# Patient Record
Sex: Male | Born: 1965 | Race: White | Hispanic: No | Marital: Single | Smoking: Never smoker
Health system: Southern US, Community
[De-identification: ages and names within clinical notes are randomized; demographics above are authoritative.]

## PROBLEM LIST (undated history)

## (undated) DIAGNOSIS — K5792 Diverticulitis of intestine, part unspecified, without perforation or abscess without bleeding: Secondary | ICD-10-CM

## (undated) DIAGNOSIS — E669 Obesity, unspecified: Secondary | ICD-10-CM

## (undated) DIAGNOSIS — E785 Hyperlipidemia, unspecified: Secondary | ICD-10-CM

## (undated) HISTORY — DX: Obesity, unspecified: E66.9

## (undated) HISTORY — PX: HERNIA REPAIR: SHX51

---

## 2001-01-30 ENCOUNTER — Ambulatory Visit (HOSPITAL_BASED_OUTPATIENT_CLINIC_OR_DEPARTMENT_OTHER): Admission: RE | Admit: 2001-01-30 | Discharge: 2001-01-30 | Payer: Self-pay | Admitting: General Surgery

## 2009-10-13 ENCOUNTER — Encounter: Admission: RE | Admit: 2009-10-13 | Discharge: 2009-10-13 | Payer: Self-pay | Admitting: Family Medicine

## 2010-02-04 ENCOUNTER — Encounter: Admission: RE | Admit: 2010-02-04 | Discharge: 2010-02-04 | Payer: Self-pay | Admitting: Orthopedic Surgery

## 2010-11-01 ENCOUNTER — Encounter: Payer: Self-pay | Admitting: Orthopedic Surgery

## 2011-02-26 NOTE — Op Note (Signed)
[facility]  Patient:    Matthew Mack, Matthew Mack                         MRN: [id number] Proc. Date: 01/30/01 Adm. Date:  [id number] Attending:  Glenna Fellows Tappan                           Operative Report  PREOPERATIVE DIAGNOSIS:  Left inguinal hernia.  POSTOPERATIVE DIAGNOSIS:  Left inguinal hernia.  PROCEDURE:  Laparoscopic repair of left inguinal hernia.  SURGEON:  Lorne Skeens. Hoxworth, M.D.  ANESTHESIA:  General.  BRIEF HISTORY:  Mr. Mauger is a 45 year old white male who recently presented with the onset of pain and a bulge in his left groin, and exam confirms a moderate-size reducible left inguinal hernia.  No hernia was palpable on the right.  Options for repair, including open and laparoscopic repair, were discussed, and we have elected to proceed with laparoscopic left inguinal hernia repair.  The nature of the procedure, its indications and risks of bleeding, infection, and recurrence, were discussed and understood preoperatively.  He is now brought to the operating room for this procedure.  DESCRIPTION OF PROCEDURE:  Patient brought to the operating room and placed in the supine position on the operating table, and general laryngeal mask anesthesia was induced.  Foley catheter was placed.  The abdomen was sterilely prepped and draped.  Local anesthesia was used to infiltrate the trocar sites prior to the incisions.  A 1 cm incision was made in the umbilicus and dissection carried down to the anterior fascia.  This was incised transversely for 1 cm and the medial edge of the left rectus muscle identified and the preperitoneal space entered below this.  The balloon dissector was placed into this space and the tip passed carefully to the pubic symphysis.  It was then inflated with good bilateral deployment of the balloon, and it was left in place for three minutes.  The balloon was then removed and the structural balloon placed and  pneumoperitoneum established.  There was very good dissection of the preperitoneal space.  The right side was not explored.  On the left, the pubic symphysis was identified and Coopers ligament cleared down to the iliac vessels, which were carefully identified and protected. Laterally there had been good dissection of the peritoneum off the anterior abdominal wall out to the anterior superior iliac spine.  The peritoneum was further dissected posteriorly, working medially.  The epigastric vessels were identified and protected along the anterior abdominal wall.  The cord structures were identified.  There was no direct defect medial to the epigastric vessels.  There was, however, a good-sized indirect sac associated with the cord structures.  This peritoneal sac was carefully dissected away from cord structures and completely dissected out of the internal ring and then dissected well posteriorly off the cord structures.  The cord structure was skeletonized and encircled, and no further sac was associated with this. There was also a moderate-sized cord lipoma that was dissected out of the internal ring and dissected posteriorly.  Following thorough dissection, a piece of Prolene mesh was trimmed 4 x 6 inches, slightly tapering laterally, and was placed into the preperitoneal space and unfurled.  It was initially tacked to the pubic symphysis and then aligned and tacked along the Coopers ligament, down to the iliac vessels.  The mesh was then tacked along the midline, working superiorly.  The anterior superior corner was then tacked well out toward the anterior superior iliac spine, and the superior edge of the mesh was tacked to the anterior abdominal wall, carefully avoiding the epigastric vessels and being able to easily feel each tack through the anterior abdominal wall.  All tacks were placed being able to feel them through the anterior abdominal wall to avoid nerve injury.  This provided  very good coverage of the direct and indirect spaces.  Following this, the dissected indirect sac was placed deep to the mesh and held to the mesh with one tack.  Trocars were removed under direct vision, all CO2 evacuated.  The fascial defect in the midline was closed with a figure-of-eight suture of 0 Vicryl.  Skin incisions were closed with interrupted subcuticular 4-0 Monocryl and Steri-Strips.  Sponge and needle counts correct.  Dry sterile dressings were applied and the patient taken to the recovery room in good condition. DD:  01/30/01 TD:  01/31/01 Job: [id number]

## 2017-12-30 DIAGNOSIS — R7301 Impaired fasting glucose: Secondary | ICD-10-CM | POA: Diagnosis not present

## 2017-12-30 DIAGNOSIS — E669 Obesity, unspecified: Secondary | ICD-10-CM | POA: Diagnosis not present

## 2017-12-30 DIAGNOSIS — R030 Elevated blood-pressure reading, without diagnosis of hypertension: Secondary | ICD-10-CM | POA: Diagnosis not present

## 2017-12-30 DIAGNOSIS — R5383 Other fatigue: Secondary | ICD-10-CM | POA: Diagnosis not present

## 2018-04-07 DIAGNOSIS — Z23 Encounter for immunization: Secondary | ICD-10-CM | POA: Diagnosis not present

## 2018-04-07 DIAGNOSIS — Z0000 Encounter for general adult medical examination without abnormal findings: Secondary | ICD-10-CM | POA: Diagnosis not present

## 2018-04-07 DIAGNOSIS — Z125 Encounter for screening for malignant neoplasm of prostate: Secondary | ICD-10-CM | POA: Diagnosis not present

## 2018-04-07 DIAGNOSIS — Z13220 Encounter for screening for lipoid disorders: Secondary | ICD-10-CM | POA: Diagnosis not present

## 2018-08-18 DIAGNOSIS — Z23 Encounter for immunization: Secondary | ICD-10-CM | POA: Diagnosis not present

## 2019-04-10 DIAGNOSIS — Z0000 Encounter for general adult medical examination without abnormal findings: Secondary | ICD-10-CM | POA: Diagnosis not present

## 2019-06-22 ENCOUNTER — Other Ambulatory Visit: Payer: Self-pay

## 2019-06-22 ENCOUNTER — Ambulatory Visit
Admission: EM | Admit: 2019-06-22 | Discharge: 2019-06-22 | Disposition: A | Discharge status: Home / Self Care | Payer: BC Managed Care – PPO | Attending: Emergency Medicine | Admitting: Emergency Medicine

## 2019-06-22 ENCOUNTER — Encounter: Payer: Self-pay | Admitting: Emergency Medicine

## 2019-06-22 DIAGNOSIS — K5732 Diverticulitis of large intestine without perforation or abscess without bleeding: Secondary | ICD-10-CM | POA: Diagnosis not present

## 2019-06-22 HISTORY — DX: Diverticulitis of intestine, part unspecified, without perforation or abscess without bleeding: K57.92

## 2019-06-22 MED ORDER — CIPROFLOXACIN HCL 500 MG PO TABS: 500.0000 mg | ORAL_TABLET | Freq: Two times a day (BID) | ORAL | 0 refills | Status: DC

## 2019-06-22 MED ORDER — METRONIDAZOLE 500 MG PO TABS: 500.0000 mg | ORAL_TABLET | Freq: Three times a day (TID) | ORAL | 0 refills | Status: DC

## 2019-06-22 NOTE — ED Provider Notes (Signed)
[name]    CSN: [id number] Arrival date & time: 06/22/19  1824      History   Chief Complaint Chief Complaint  Patient presents with  . Abdominal Pain    HPI Matthew Mack is a 53 y.o. male history of diverticulitis presenting for diffuse lower abdominal pain for last 3 days.  States pain was worse last night, feeling slightly improved today.  States is been years since his last flare which resolved with oral antibiotics.  Endorsing nausea, diarrhea, decreased appetite.  Denies blood, mucus, fever, decreased flatus.  Patient is status post hernia repair that was done laparoscopically with mesh placement greater than 5 years ago without postoperative complication.  Patient denies coagulopathy, blood thinner/anticoagulant use.  Past Medical History:  Diagnosis Date  . Diverticulitis     There are no active problems to display for this patient.   Past Surgical History:  Procedure Laterality Date  . HERNIA REPAIR         Home Medications    Prior to Admission medications   Medication Sig Start Date End Date Taking? Authorizing Provider  ciprofloxacin (CIPRO) 500 MG tablet Take 1 tablet (500 mg total) by mouth 2 (two) times daily for 7 days. 06/22/19 06/29/19  Hall-Potvin, [place], PA-C  metroNIDAZOLE (FLAGYL) 500 MG tablet Take 1 tablet (500 mg total) by mouth 3 (three) times daily for 7 days. 06/22/19 06/29/19  Hall-Potvin, [place], PA-C    Family History Family History  Problem Relation Age of Onset  . Diabetes Mother     Social History Social History   Tobacco Use  . Smoking status: Never Smoker  . Smokeless tobacco: Never Used  Substance Use Topics  . Alcohol use: Not Currently    Frequency: Never  . Drug use: Never     Allergies   Penicillins and Sulfa antibiotics   Review of Systems Review of Systems  Constitutional: Positive for appetite change. Negative for activity change, chills, fatigue and fever.  Respiratory: Negative for  cough and shortness of breath.   Cardiovascular: Negative for chest pain, palpitations and leg swelling.  Gastrointestinal: Positive for abdominal pain, diarrhea and nausea. Negative for abdominal distention, anal bleeding, blood in stool, constipation, rectal pain and vomiting.  Genitourinary: Negative for dysuria, frequency and hematuria.  Musculoskeletal: Negative for arthralgias, back pain, gait problem, myalgias, neck pain and neck stiffness.  Skin: Negative for rash and wound.  Neurological: Negative for speech difficulty and headaches.  All other systems reviewed and are negative.    Physical Exam Triage Vital Signs ED Triage Vitals [06/22/19 1838]  Enc Vitals Group     BP (!) 143/92     Pulse Rate 90     Resp 18     Temp 98.7 F (37.1 C)     Temp Source Oral     SpO2 97 %     Weight      Height      Head Circumference      Peak Flow      Pain Score 7     Pain Loc      Pain Edu?      Excl. in GC?    No data found.  Updated Vital Signs BP (!) 143/92 (BP Location: Left Arm)   Pulse 90   Temp 98.7 F (37.1 C) (Oral)   Resp 18   SpO2 97%   Visual Acuity Right Eye Distance:   Left Eye Distance:   Bilateral Distance:    Right  Eye Near:   Left Eye Near:    Bilateral Near:     Physical Exam Constitutional:      General: He is not in acute distress.    Appearance: He is well-developed. He is not ill-appearing.  HENT:     Head: Normocephalic and atraumatic.     Mouth/Throat:     Mouth: Mucous membranes are moist.     Pharynx: Oropharynx is clear.  Eyes:     General: No scleral icterus.    Extraocular Movements: Extraocular movements intact.     Pupils: Pupils are equal, round, and reactive to light.  Cardiovascular:     Rate and Rhythm: Normal rate and regular rhythm.     Heart sounds: No murmur. No gallop.   Pulmonary:     Effort: Pulmonary effort is normal. No respiratory distress.     Breath sounds: No wheezing, rhonchi or rales.  Abdominal:      General: A surgical scar is present. Bowel sounds are normal. There is no distension or abdominal bruit.     Palpations: There is no hepatomegaly, splenomegaly or mass.     Tenderness: There is abdominal tenderness in the right lower quadrant, suprapubic area and left lower quadrant. There is guarding and rebound. There is no right CVA tenderness or left CVA tenderness. Negative signs include Murphy's sign and Rovsing's sign.     Hernia: No hernia is present.     Comments: Voluntary guarding present  Skin:    Capillary Refill: Capillary refill takes less than 2 seconds.     Coloration: Skin is not jaundiced or pale.  Neurological:     General: No focal deficit present.     Mental Status: He is alert and oriented to person, place, and time.  Psychiatric:        Mood and Affect: Mood is not anxious.        Behavior: Behavior normal.      UC Treatments / Results  Labs (all labs ordered are listed, but only abnormal results are displayed) Labs Reviewed - No data to display  EKG   Radiology No results found.  Procedures Procedures (including critical care time)  Medications Ordered in UC Medications - No data to display  Initial Impression / Assessment and Plan / UC Course  I have reviewed the triage vital signs and the nursing notes.  Pertinent labs & imaging results that were available during my care of the patient were reviewed by me and considered in my medical decision making (see chart for details).     1.  Diverticulitis of colon Severe abdominal pain with history of diverticulitis as evidenced on colonoscopy with remote flare that improved with outpatient antibiotic management.  Discussed concern about rebound tenderness with patient as possible peritoneal sign.  Recommended the patient go to ER for further evaluation (CT scan): Declined as he feels he has improved since last night, would like to trial antibiotic therapy.  Will start ciprofloxacin, metronidazole as  listed below tonight.  Emphasized strict ER precautions as listed below, particularly over the next 24 hours and to allow pain be his guide.  Verbalized understanding, agreeable to plan. Final Clinical Impressions(s) / UC Diagnoses   Final diagnoses:  Diverticulitis of colon     Discharge Instructions     Go to ER if you develop fever, worsening abdominal pain, blood in your stool, decreased flatus/bowel movements. You should also go to the ER for further evaluation if there is no improvement in pain  over the next 24 hours. Important to take both antibiotics as directed for the entire duration.    ED Prescriptions    Medication Sig Dispense Auth. Provider   ciprofloxacin (CIPRO) 500 MG tablet Take 1 tablet (500 mg total) by mouth 2 (two) times daily for 7 days. 14 tablet Hall-Potvin, [place], PA-C   metroNIDAZOLE (FLAGYL) 500 MG tablet Take 1 tablet (500 mg total) by mouth 3 (three) times daily for 7 days. 21 tablet Hall-Potvin, [place], PA-C     Controlled Substance Prescriptions [place] Controlled Substance Registry consulted? Not Applicable   Quincy Sheehan, [place] 06/22/19 1947

## 2019-06-22 NOTE — Discharge Instructions (Signed)
Go to ER if you develop fever, worsening abdominal pain, blood in your stool, decreased flatus/bowel movements. You should also go to the ER for further evaluation if there is no improvement in pain over the next 24 hours. Important to take both antibiotics as directed for the entire duration.

## 2019-06-22 NOTE — ED Notes (Signed)
Patient able to ambulate independently  

## 2019-06-22 NOTE — ED Triage Notes (Signed)
Pt presents to [facility] for assessment of lower bilateral abdominal pain with some nausea, diarrhea (notes no blood), and felt clammy this morning after a bad night of pain last night.  States heating pad to area had helped with pain.  Hx of diverticulitis.

## 2019-06-23 ENCOUNTER — Encounter (HOSPITAL_COMMUNITY): Payer: Self-pay

## 2019-06-23 ENCOUNTER — Inpatient Hospital Stay (HOSPITAL_COMMUNITY)
Admission: EM | Admit: 2019-06-23 | Discharge: 2019-06-29 | DRG: 391 | Disposition: A | Discharge status: Home / Self Care | Payer: BC Managed Care – PPO | Attending: General Surgery | Admitting: General Surgery

## 2019-06-23 ENCOUNTER — Other Ambulatory Visit: Payer: Self-pay

## 2019-06-23 ENCOUNTER — Emergency Department (HOSPITAL_COMMUNITY): Payer: BC Managed Care – PPO

## 2019-06-23 DIAGNOSIS — K5792 Diverticulitis of intestine, part unspecified, without perforation or abscess without bleeding: Secondary | ICD-10-CM | POA: Diagnosis not present

## 2019-06-23 DIAGNOSIS — Z880 Allergy status to penicillin: Secondary | ICD-10-CM | POA: Diagnosis not present

## 2019-06-23 DIAGNOSIS — K668 Other specified disorders of peritoneum: Secondary | ICD-10-CM | POA: Diagnosis not present

## 2019-06-23 DIAGNOSIS — K5720 Diverticulitis of large intestine with perforation and abscess without bleeding: Principal | ICD-10-CM | POA: Diagnosis present

## 2019-06-23 DIAGNOSIS — K659 Peritonitis, unspecified: Secondary | ICD-10-CM | POA: Diagnosis not present

## 2019-06-23 DIAGNOSIS — Z20828 Contact with and (suspected) exposure to other viral communicable diseases: Secondary | ICD-10-CM | POA: Diagnosis present

## 2019-06-23 DIAGNOSIS — Z882 Allergy status to sulfonamides status: Secondary | ICD-10-CM | POA: Diagnosis not present

## 2019-06-23 DIAGNOSIS — R109 Unspecified abdominal pain: Secondary | ICD-10-CM | POA: Diagnosis not present

## 2019-06-23 DIAGNOSIS — K631 Perforation of intestine (nontraumatic): Secondary | ICD-10-CM | POA: Diagnosis not present

## 2019-06-23 LAB — CBC
HCT: 50.3 % (ref 39.0–52.0)
Hemoglobin: 17 g/dL (ref 13.0–17.0)
MCH: 31.5 pg (ref 26.0–34.0)
MCHC: 33.8 g/dL (ref 30.0–36.0)
MCV: 93.1 fL (ref 80.0–100.0)
Platelets: 190 10*3/uL (ref 150–400)
RBC: 5.4 MIL/uL (ref 4.22–5.81)
RDW: 12.7 % (ref 11.5–15.5)
WBC: 11.8 10*3/uL — ABNORMAL HIGH (ref 4.0–10.5)
nRBC: 0 % (ref 0.0–0.2)

## 2019-06-23 LAB — COMPREHENSIVE METABOLIC PANEL
ALT: 31 U/L (ref 0–44)
AST: 18 U/L (ref 15–41)
Albumin: 4.6 g/dL (ref 3.5–5.0)
Alkaline Phosphatase: 64 U/L (ref 38–126)
Anion gap: 9 (ref 5–15)
BUN: 12 mg/dL (ref 6–20)
CO2: 24 mmol/L (ref 22–32)
Calcium: 9.4 mg/dL (ref 8.9–10.3)
Chloride: 105 mmol/L (ref 98–111)
Creatinine, Ser: 1.1 mg/dL (ref 0.61–1.24)
GFR calc Af Amer: >60 mL/min (ref >60)
GFR calc non Af Amer: >60 mL/min (ref >60)
Glucose, Bld: 121 mg/dL — ABNORMAL HIGH (ref 70–99)
Potassium: 3.5 mmol/L (ref 3.5–5.1)
Sodium: 138 mmol/L (ref 135–145)
Total Bilirubin: 1.5 mg/dL — ABNORMAL HIGH (ref 0.3–1.2)
Total Protein: 8.4 g/dL — ABNORMAL HIGH (ref 6.5–8.1)

## 2019-06-23 LAB — URINALYSIS, ROUTINE W REFLEX MICROSCOPIC
Bacteria, UA: NONE SEEN
Bilirubin Urine: NEGATIVE
Glucose, UA: NEGATIVE mg/dL
Hgb urine dipstick: NEGATIVE
Ketones, ur: 80 mg/dL — AB
Nitrite: NEGATIVE
Protein, ur: 30 mg/dL — AB
Specific Gravity, Urine: 1.02 (ref 1.005–1.030)
pH: 5 (ref 5.0–8.0)

## 2019-06-23 LAB — LACTIC ACID, PLASMA: Lactic Acid, Venous: 1 mmol/L (ref 0.5–1.9)

## 2019-06-23 LAB — SARS CORONAVIRUS 2 BY RT PCR (HOSPITAL ORDER, PERFORMED IN ~~LOC~~ HOSPITAL LAB): SARS Coronavirus 2: NEGATIVE

## 2019-06-23 LAB — LIPASE, BLOOD: Lipase: 22 U/L (ref 11–51)

## 2019-06-23 MED ORDER — SODIUM CHLORIDE 0.9% FLUSH
3.0000 mL | Freq: Once | INTRAVENOUS | Status: AC
  Administered 2019-06-26: 3 mL via INTRAVENOUS

## 2019-06-23 MED ORDER — MORPHINE SULFATE (PF) 4 MG/ML IV SOLN
4.0000 mg | Freq: Once | INTRAVENOUS | Status: AC
  Administered 2019-06-23: 4 mg via INTRAVENOUS
  Filled 2019-06-23: qty 1

## 2019-06-23 MED ORDER — SODIUM CHLORIDE 0.9 % IV BOLUS
1000.0000 mL | Freq: Once | INTRAVENOUS | Status: AC
  Administered 2019-06-23: 1000 mL via INTRAVENOUS

## 2019-06-23 MED ORDER — METRONIDAZOLE IN NACL 5-0.79 MG/ML-% IV SOLN
500.0000 mg | Freq: Once | INTRAVENOUS | Status: AC
  Administered 2019-06-24: 500 mg via INTRAVENOUS
  Filled 2019-06-23: qty 100

## 2019-06-23 MED ORDER — ONDANSETRON HCL 4 MG/2ML IJ SOLN
4.0000 mg | Freq: Once | INTRAMUSCULAR | Status: AC
  Administered 2019-06-23: 4 mg via INTRAVENOUS
  Filled 2019-06-23: qty 2

## 2019-06-23 MED ORDER — SODIUM CHLORIDE (PF) 0.9 % IJ SOLN
INTRAMUSCULAR | Status: AC
  Filled 2019-06-23: qty 50

## 2019-06-23 MED ORDER — SODIUM CHLORIDE 0.9 % IV SOLN
2.0000 g | Freq: Once | INTRAVENOUS | Status: AC
  Administered 2019-06-23: 2 g via INTRAVENOUS
  Filled 2019-06-23: qty 2

## 2019-06-23 MED ORDER — IOHEXOL 300 MG/ML  SOLN
100.0000 mL | Freq: Once | INTRAMUSCULAR | Status: AC | PRN
  Administered 2019-06-23: 100 mL via INTRAVENOUS

## 2019-06-23 NOTE — ED Notes (Signed)
Pt out of room in radiology.

## 2019-06-23 NOTE — ED Provider Notes (Signed)
[facility]-EMERGENCY DEPT Provider Note   CSN: [id number] Arrival date & time: 06/23/19  1947     History   Chief Complaint Chief Complaint  Patient presents with   Abdominal Pain    HPI Matthew Mack is a 53 y.o. male with history of diverticulitis and distant hernia repair who presents with a few day history of lower abdominal pain.  Patient reports initially started out feeling like his previous diverticulitis flare and he was seen in urgent care yesterday and started on Cipro and Flagyl.  They recommended that he go to the ED for CT scan, however he states that his last flare responded well to oral antibiotics, so he wanted to try that first.  Symptoms have progressively worsened and is having more pain on the right side now.  He is having some associated nausea but no vomiting, as well as diarrhea.  Patient has taken Tylenol without much relief.  Patient denies any chest pain, shortness of breath.  Patient states he has had some urinary frequency.     HPI  Past Medical History:  Diagnosis Date   Diverticulitis     Patient Active Problem List   Diagnosis Date Noted   Perforated diverticulum of large intestine 06/24/2019    Past Surgical History:  Procedure Laterality Date   HERNIA REPAIR          Home Medications    Prior to Admission medications   Medication Sig Start Date End Date Taking? Authorizing Provider  acetaminophen (TYLENOL) 325 MG tablet Take 650 mg by mouth every 6 (six) hours as needed for moderate pain or headache.   Yes [provider]  ciprofloxacin (CIPRO) 500 MG tablet Take 1 tablet (500 mg total) by mouth 2 (two) times daily for 7 days. 06/22/19 06/29/19 Yes Hall-Potvin, [place], PA-C  metroNIDAZOLE (FLAGYL) 500 MG tablet Take 1 tablet (500 mg total) by mouth 3 (three) times daily for 7 days. 06/22/19 06/29/19 Yes Hall-Potvin, [place], PA-C    Family History Family History  Problem Relation Age of Onset    Diabetes Mother     Social History Social History   Tobacco Use   Smoking status: Never Smoker   Smokeless tobacco: Never Used  Substance Use Topics   Alcohol use: Not Currently    Frequency: Never   Drug use: Never     Allergies   Penicillins and Sulfa antibiotics   Review of Systems Review of Systems  Constitutional: Negative for chills and fever.  HENT: Negative for facial swelling and sore throat.   Respiratory: Negative for shortness of breath.   Cardiovascular: Negative for chest pain.  Gastrointestinal: Positive for abdominal pain, diarrhea and nausea. Negative for blood in stool and vomiting.  Genitourinary: Positive for frequency. Negative for dysuria.  Musculoskeletal: Negative for back pain.  Skin: Negative for rash and wound.  Neurological: Negative for headaches.  Psychiatric/Behavioral: The patient is not nervous/anxious.      Physical Exam Updated Vital Signs BP (!) 145/95    Pulse 87    Temp 98.5 F (36.9 C) (Oral)    Resp 16    Ht 5\' 10"  (1.778 m)    Wt 102.1 kg    SpO2 95%    BMI 32.28 kg/m   Physical Exam Vitals signs and nursing note reviewed.  Constitutional:      General: He is not in acute distress.    Appearance: He is well-developed. He is not diaphoretic.  HENT:     Head:  Normocephalic and atraumatic.     Mouth/Throat:     Pharynx: No oropharyngeal exudate.  Eyes:     General: No scleral icterus.       Right eye: No discharge.        Left eye: No discharge.     Conjunctiva/sclera: Conjunctivae normal.     Pupils: Pupils are equal, round, and reactive to light.  Neck:     Musculoskeletal: Normal range of motion and neck supple.     Thyroid: No thyromegaly.  Cardiovascular:     Rate and Rhythm: Regular rhythm. Tachycardia present.     Heart sounds: Normal heart sounds. No murmur. No friction rub. No gallop.   Pulmonary:     Effort: Pulmonary effort is normal. No respiratory distress.     Breath sounds: Normal breath sounds.  No stridor. No wheezing or rales.  Abdominal:     General: Bowel sounds are normal. There is no distension.     Palpations: Abdomen is soft.     Tenderness: There is generalized abdominal tenderness and tenderness in the right lower quadrant and left lower quadrant. There is guarding and rebound. There is no right CVA tenderness or left CVA tenderness. Positive signs include Rovsing's sign and McBurney's sign. Negative signs include psoas sign and obturator sign.  Lymphadenopathy:     Cervical: No cervical adenopathy.  Skin:    General: Skin is warm and dry.     Coloration: Skin is not pale.     Findings: No rash.  Neurological:     Mental Status: He is alert.     Coordination: Coordination normal.      ED Treatments / Results  Labs (all labs ordered are listed, but only abnormal results are displayed) Labs Reviewed  COMPREHENSIVE METABOLIC PANEL - Abnormal; Notable for the following components:      Result Value   Glucose, Bld 121 (*)    Total Protein 8.4 (*)    Total Bilirubin 1.5 (*)    All other components within normal limits  CBC - Abnormal; Notable for the following components:   WBC 11.8 (*)    All other components within normal limits  URINALYSIS, ROUTINE W REFLEX MICROSCOPIC - Abnormal; Notable for the following components:   APPearance HAZY (*)    Ketones, ur 80 (*)    Protein, ur 30 (*)    Leukocytes,Ua TRACE (*)    All other components within normal limits  SARS CORONAVIRUS 2 (HOSPITAL ORDER, [place] [facility])  LIPASE, BLOOD  LACTIC ACID, PLASMA    EKG None  Radiology Ct Abdomen Pelvis W Contrast  Result Date: 06/23/2019 CLINICAL DATA:  Generalized right left lower quadrant abdominal pain, guarding and rebound tenderness EXAM: CT ABDOMEN AND PELVIS WITH CONTRAST TECHNIQUE: Multidetector CT imaging of the abdomen and pelvis was performed using the standard protocol following bolus administration of intravenous contrast. CONTRAST:  151mL  OMNIPAQUE IOHEXOL 300 MG/ML  SOLN COMPARISON:  None. FINDINGS: Lower chest: Mild basilar atelectatic changes. Normal heart size. No pericardial effusion. Upward paraesophageal herniation of omental/mesenteric fat through the diaphragmatic hiatus. Small amount of mediastinal gas is present, likely arising from the viscus perforation as below. Hepatobiliary: No focal liver abnormality is seen. No gallstones, gallbladder wall thickening, or biliary dilatation. Foci of gas along the anterior liver and within the falciform ligament and gallbladder fossa. Pancreas: Unremarkable. No pancreatic ductal dilatation or surrounding inflammatory changes. Spleen: Normal in size without focal abnormality. Adrenals/Urinary Tract: Adrenal glands are unremarkable.  Kidneys are normal, without renal calculi, focal lesion, or hydronephrosis. Bladder is unremarkable. Stomach/Bowel: Diffuse colonic diverticulosis with focal segmental, circumferential thickening the proximal sigmoid colon and features of perforated diverticulitis centered upon a culprit diverticulum (2/87). There is a small amount of extraluminal fluid and numerous foci of extraluminal gas both adjacent and throughout the abdomen, as detailed above. Marked edematous thickening and inflammation of the adjacent epiploic appendages. There is likely reactive thickening of the adjacent small bowel loops with mild diffuse fluid distention which could reflect a reactive ileus. Vascular/Lymphatic: Trace atheromatous plaque within the normal caliber aorta. Reactive adenopathy in the mid abdomen Reproductive: The prostate and seminal vesicles are unremarkable. Other: Scattered foci of free intraperitoneal air throughout the abdomen and lower mediastinum, as detailed above. Likely arising from the site of perforated diverticulitis in the left lower quadrant. Reactive free fluid is seen in the abdomen as well. Mild peritoneal thickening and enhancement in the left lower quadrant.  Postsurgical changes from prior abdominal mesh anchor repair is noted in the left lower quadrant as well. Musculoskeletal: No acute osseous abnormality or suspicious osseous lesion. IMPRESSION: Findings compatible with a perforated diverticulitis with extraluminal gas and a trace amount of non organized extraluminal fluid. There is reactive inflammation of the adjacent epiploic appendages and small bowel loops coursing in close proximity as well as additional features of a reactive ileus. Peritoneal thickening and enhancement suggestive of peritonitis. Small amount of lower mediastinal gas likely related to the perforated appendicitis, appears contained within a fat containing hiatal hernia. These results were called by telephone at the time of interpretation on 06/23/2019 at 11:17 pm to provider Miloh Alcocer , who verbally acknowledged these results. Electronically Signed   By: Kreg ShropshirePrice  DeHay M.D.   On: 06/23/2019 23:18    Procedures .Critical Care Performed by: Emi HolesLaw, Ranay Ketter M, PA-C Authorized by: Emi HolesLaw, Laiken Sandy M, PA-C   Critical care provider statement:    Critical care time (minutes):  45   Critical care was necessary to treat or prevent imminent or life-threatening deterioration of the following conditions: perforated viscus.   Critical care was time spent personally by me on the following activities:  Discussions with consultants, evaluation of patient's response to treatment, examination of patient, ordering and performing treatments and interventions, ordering and review of laboratory studies, ordering and review of radiographic studies, pulse oximetry, re-evaluation of patient's condition, obtaining history from patient or surrogate and review of old charts   I assumed direction of critical care for this patient from another provider in my specialty: no     (including critical care time)  Medications Ordered in ED Medications  sodium chloride flush (NS) 0.9 % injection 3 mL (has no  administration in time range)  sodium chloride (PF) 0.9 % injection (has no administration in time range)  ceFEPIme (MAXIPIME) 2 g in sodium chloride 0.9 % 100 mL IVPB (0 g Intravenous Stopped 06/24/19 0005)    And  metroNIDAZOLE (FLAGYL) IVPB 500 mg (500 mg Intravenous New Bag/Given 06/24/19 0006)  sodium chloride 0.9 % bolus 1,000 mL (0 mLs Intravenous Stopped 06/23/19 2300)  morphine 4 MG/ML injection 4 mg (4 mg Intravenous Given 06/23/19 2132)  ondansetron (ZOFRAN) injection 4 mg (4 mg Intravenous Given 06/23/19 2132)  iohexol (OMNIPAQUE) 300 MG/ML solution 100 mL (100 mLs Intravenous Contrast Given 06/23/19 2228)     Initial Impression / Assessment and Plan / ED Course  I have reviewed the triage vital signs and the nursing notes.  Pertinent labs & imaging  results that were available during my care of the patient were reviewed by me and considered in my medical decision making (see chart for details).        Patient with history of diverticulitis presenting with abdominal pain.  On examination, he had an acute abdomen with peritonitis on exam.  He is found to have perforated sigmoid diverticulitis with some free air and also a reactive ileus.  Cefepime and Zosyn initiated considering patient's allergy to penicillin.  Leukocytosis at 11,000.  Patient very well-appearing with stable vitals except for some tachycardia on arrival.  I discussed patient case with general surgeon on-call, Dr. Carolynne Edouardoth, who evaluated the patient and will admit for antibiotic therapy and possible surgery.  I appreciate his assistance with the patient. I discussed patient case with Dr. Anitra LauthPlunkett who guided the patient's management and agrees with plan.   Final Clinical Impressions(s) / ED Diagnoses   Final diagnoses:  Perforation of sigmoid colon due to diverticulitis    ED Discharge Orders    None       Emi HolesLaw, Lavaris Sexson M, PA-C 06/24/19 0056    Gwyneth SproutPlunkett, [place], MD 06/24/19 2044

## 2019-06-23 NOTE — ED Triage Notes (Signed)
Pt reports bilateral lower abdominal pain accompanied by nausea. Denies blood in stool and states that he was having some diarrhea yesterday, but today had a normal BM. He was started on antibiotics yesterday from UC. Pt reports a hx of diverticulitis. Pain is worse with movement.

## 2019-06-23 NOTE — ED Notes (Signed)
Consulting Marlou Starks, MD) Provider at bedside.

## 2019-06-24 ENCOUNTER — Encounter (HOSPITAL_COMMUNITY): Payer: Self-pay

## 2019-06-24 DIAGNOSIS — K5720 Diverticulitis of large intestine with perforation and abscess without bleeding: Secondary | ICD-10-CM | POA: Diagnosis present

## 2019-06-24 DIAGNOSIS — K659 Peritonitis, unspecified: Secondary | ICD-10-CM | POA: Diagnosis present

## 2019-06-24 DIAGNOSIS — Z882 Allergy status to sulfonamides status: Secondary | ICD-10-CM | POA: Diagnosis not present

## 2019-06-24 DIAGNOSIS — K668 Other specified disorders of peritoneum: Secondary | ICD-10-CM | POA: Diagnosis present

## 2019-06-24 DIAGNOSIS — Z20828 Contact with and (suspected) exposure to other viral communicable diseases: Secondary | ICD-10-CM | POA: Diagnosis present

## 2019-06-24 DIAGNOSIS — Z880 Allergy status to penicillin: Secondary | ICD-10-CM | POA: Diagnosis not present

## 2019-06-24 LAB — BASIC METABOLIC PANEL
Anion gap: 9 (ref 5–15)
BUN: 11 mg/dL (ref 6–20)
CO2: 25 mmol/L (ref 22–32)
Calcium: 8.8 mg/dL — ABNORMAL LOW (ref 8.9–10.3)
Chloride: 105 mmol/L (ref 98–111)
Creatinine, Ser: 1.2 mg/dL (ref 0.61–1.24)
GFR calc Af Amer: >60 mL/min (ref >60)
GFR calc non Af Amer: >60 mL/min (ref >60)
Glucose, Bld: 128 mg/dL — ABNORMAL HIGH (ref 70–99)
Potassium: 3.7 mmol/L (ref 3.5–5.1)
Sodium: 139 mmol/L (ref 135–145)

## 2019-06-24 LAB — CBC
HCT: 48.7 % (ref 39.0–52.0)
Hemoglobin: 16.2 g/dL (ref 13.0–17.0)
MCH: 31.5 pg (ref 26.0–34.0)
MCHC: 33.3 g/dL (ref 30.0–36.0)
MCV: 94.6 fL (ref 80.0–100.0)
Platelets: 186 10*3/uL (ref 150–400)
RBC: 5.15 MIL/uL (ref 4.22–5.81)
RDW: 12.8 % (ref 11.5–15.5)
WBC: 10 10*3/uL (ref 4.0–10.5)
nRBC: 0 % (ref 0.0–0.2)

## 2019-06-24 LAB — HIV ANTIBODY (ROUTINE TESTING W REFLEX): HIV Screen 4th Generation wRfx: NONREACTIVE

## 2019-06-24 MED ORDER — PANTOPRAZOLE SODIUM 40 MG IV SOLR
40.0000 mg | Freq: Every day | INTRAVENOUS | Status: DC
  Administered 2019-06-24 – 2019-06-28 (×6): 40 mg via INTRAVENOUS
  Filled 2019-06-24 (×6): qty 40

## 2019-06-24 MED ORDER — SODIUM CHLORIDE 0.9 % IV SOLN
2.0000 g | Freq: Three times a day (TID) | INTRAVENOUS | Status: DC
  Administered 2019-06-24 – 2019-06-29 (×16): 2 g via INTRAVENOUS
  Filled 2019-06-24 (×18): qty 2

## 2019-06-24 MED ORDER — ONDANSETRON HCL 4 MG/2ML IJ SOLN
4.0000 mg | Freq: Four times a day (QID) | INTRAMUSCULAR | Status: DC | PRN
  Administered 2019-06-24 – 2019-06-27 (×4): 4 mg via INTRAVENOUS
  Filled 2019-06-24 (×5): qty 2

## 2019-06-24 MED ORDER — MORPHINE SULFATE (PF) 2 MG/ML IV SOLN
1.0000 mg | INTRAVENOUS | Status: DC | PRN
  Administered 2019-06-24 (×3): 2 mg via INTRAVENOUS
  Filled 2019-06-24 (×3): qty 1

## 2019-06-24 MED ORDER — ONDANSETRON 4 MG PO TBDP: 4.0000 mg | ORAL_TABLET | Freq: Four times a day (QID) | ORAL | Status: DC | PRN

## 2019-06-24 MED ORDER — KCL IN DEXTROSE-NACL 20-5-0.9 MEQ/L-%-% IV SOLN
INTRAVENOUS | Status: DC
  Administered 2019-06-24 – 2019-06-28 (×6): via INTRAVENOUS
  Filled 2019-06-24 (×11): qty 1000

## 2019-06-24 MED ORDER — METRONIDAZOLE IN NACL 5-0.79 MG/ML-% IV SOLN
500.0000 mg | Freq: Three times a day (TID) | INTRAVENOUS | Status: DC
  Administered 2019-06-24 – 2019-06-29 (×16): 500 mg via INTRAVENOUS
  Filled 2019-06-24 (×16): qty 100

## 2019-06-24 NOTE — ED Notes (Signed)
ED TO INPATIENT HANDOFF REPORT  ED Nurse Name and Phone #: Landry Dykekelly  S Name/Age/Gender Matthew Mack 53 y.o. male Room/Bed: WA14/WA14  Code Status   Code Status: Not on file  Home/SNF/Other Home Patient oriented to: self, place, time and situation Is this baseline? Yes   Triage Complete: Triage complete  Chief Complaint abd pain  Triage Note Pt reports bilateral lower abdominal pain accompanied by nausea. Denies blood in stool and states that he was having some diarrhea yesterday, but today had a normal BM. He was started on antibiotics yesterday from UC. Pt reports a hx of diverticulitis. Pain is worse with movement.    Allergies Allergies  Allergen Reactions  . Penicillins Hives  . Sulfa Antibiotics Itching    Level of Care/Admitting Diagnosis ED Disposition    ED Disposition Condition Comment   Admit  [facility] [[id number]]  Level of Care: Med-Surg [16]  Covid Evaluation: Asymptomatic Screening Protocol (No Symptoms)  Diagnosis: Perforated diverticulum of large intestine [161096][322447]  Admitting Physician: Jayme CloudTH III, PAUL [[id number]]  Attending Physician: CCS, MD [[id number]]  Estimated length of stay: 5 - 7 days  Certification:: I certify this patient will need inpatient services for at least 2 midnights  PT Class (Do Not Modify): Inpatient [101]  PT Acc Code (Do Not Modify): Private [1]       B Medical/Surgery History Past Medical History:  Diagnosis Date  . Diverticulitis    Past Surgical History:  Procedure Laterality Date  . HERNIA REPAIR       A IV Location/Drains/Wounds Patient Lines/Drains/Airways Status   Active Line/Drains/Airways    Name:   Placement date:   Placement time:   Site:   Days:   Peripheral IV 06/23/19 Right Antecubital   06/23/19    2131    Antecubital   1          Intake/Output Last 24 hours No intake or output data in the 24 hours ending 06/24/19 0021  Labs/Imaging Results for orders placed or  performed during the hospital encounter of 06/23/19 (from the past 48 hour(s))  Lipase, blood     Status: None   Collection Time: 06/23/19  8:51 PM  Result Value Ref Range   Lipase 22 11 - 51 U/L    Comment: Performed at [facility], [address] [place], [place] [address]  Comprehensive metabolic panel     Status: Abnormal   Collection Time: 06/23/19  8:51 PM  Result Value Ref Range   Sodium 138 135 - 145 mmol/L   Potassium 3.5 3.5 - 5.1 mmol/L   Chloride 105 98 - 111 mmol/L   CO2 24 22 - 32 mmol/L   Glucose, Bld 121 (H) 70 - 99 mg/dL   BUN 12 6 - 20 mg/dL   Creatinine, Ser 9.811.10 0.61 - 1.24 mg/dL   Calcium 9.4 8.9 - 19.110.3 mg/dL   Total Protein 8.4 (H) 6.5 - 8.1 g/dL   Albumin 4.6 3.5 - 5.0 g/dL   AST 18 15 - 41 U/L   ALT 31 0 - 44 U/L   Alkaline Phosphatase 64 38 - 126 U/L   Total Bilirubin 1.5 (H) 0.3 - 1.2 mg/dL   GFR calc non Af Amer >60 >60 mL/min   GFR calc Af Amer >60 >60 mL/min   Anion gap 9 5 - 15    Comment: Performed at [facility], [address] [place], [place] [address]  CBC  Status: Abnormal   Collection Time: 06/23/19  8:51 PM  Result Value Ref Range   WBC 11.8 (H) 4.0 - 10.5 K/uL   RBC 5.40 4.22 - 5.81 MIL/uL   Hemoglobin 17.0 13.0 - 17.0 g/dL   HCT 16.150.3 09.639.0 - 04.552.0 %   MCV 93.1 80.0 - 100.0 fL   MCH 31.5 26.0 - 34.0 pg   MCHC 33.8 30.0 - 36.0 g/dL   RDW 40.912.7 81.111.5 - 91.415.5 %   Platelets 190 150 - 400 K/uL   nRBC 0.0 0.0 - 0.2 %    Comment: Performed at [facility], [address] [place], [place] [address]  Urinalysis, Routine w reflex microscopic     Status: Abnormal   Collection Time: 06/23/19  8:51 PM  Result Value Ref Range   Color, Urine YELLOW YELLOW   APPearance HAZY (A) CLEAR   Specific Gravity, Urine 1.020 1.005 - 1.030   pH 5.0 5.0 - 8.0   Glucose, UA NEGATIVE NEGATIVE mg/dL   Hgb urine dipstick NEGATIVE NEGATIVE   Bilirubin Urine NEGATIVE NEGATIVE   Ketones, ur 80 (A)  NEGATIVE mg/dL   Protein, ur 30 (A) NEGATIVE mg/dL   Nitrite NEGATIVE NEGATIVE   Leukocytes,Ua TRACE (A) NEGATIVE   RBC / HPF 0-5 0 - 5 RBC/hpf   WBC, UA 0-5 0 - 5 WBC/hpf   Bacteria, UA NONE SEEN NONE SEEN   Mucus PRESENT     Comment: Performed at [facility], [address] [place], [place] [address]  Lactic acid, plasma     Status: None   Collection Time: 06/23/19  9:33 PM  Result Value Ref Range   Lactic Acid, Venous 1.0 0.5 - 1.9 mmol/L    Comment: Performed at [facility], [address] [place], [place] [address]  SARS Coronavirus 2 [facility](Hospital order, Performed in [facility] lab) Nasopharyngeal Nasopharyngeal Swab     Status: None   Collection Time: 06/23/19  9:33 PM   Specimen: Nasopharyngeal Swab  Result Value Ref Range   SARS Coronavirus 2 NEGATIVE NEGATIVE    Comment: (NOTE) If result is NEGATIVE SARS-CoV-2 target nucleic acids are NOT DETECTED. The SARS-CoV-2 RNA is generally detectable in upper and lower  respiratory specimens during the acute phase of infection. The lowest  concentration of SARS-CoV-2 viral copies this assay can detect is 250  copies / mL. A negative result does not preclude SARS-CoV-2 infection  and should not be used as the sole basis for treatment or other  patient management decisions.  A negative result may occur with  improper specimen collection / handling, submission of specimen other  than nasopharyngeal swab, presence of viral mutation(s) within the  areas targeted by this assay, and inadequate number of viral copies  (<250 copies / mL). A negative result must be combined with clinical  observations, patient history, and epidemiological information. If result is POSITIVE SARS-CoV-2 target nucleic acids are DETECTED. The SARS-CoV-2 RNA is generally detectable in upper and lower  respiratory specimens dur ing the acute phase of infection.  Positive  results are indicative of active infection  with SARS-CoV-2.  Clinical  correlation with patient history and other diagnostic information is  necessary to determine patient infection status.  Positive results do  not rule out bacterial infection or co-infection with other viruses. If result is PRESUMPTIVE POSTIVE SARS-CoV-2 nucleic acids MAY BE PRESENT.   A presumptive positive result was obtained on the submitted specimen  and confirmed on  repeat testing.  While 2019 novel coronavirus  (SARS-CoV-2) nucleic acids may be present in the submitted sample  additional confirmatory testing may be necessary for epidemiological  and / or clinical management purposes  to differentiate between  SARS-CoV-2 and other Sarbecovirus currently known to infect humans.  If clinically indicated additional testing with an alternate test  methodology [phone]) is advised. The SARS-CoV-2 RNA is generally  detectable in upper and lower respiratory sp ecimens during the acute  phase of infection. The expected result is Negative. Fact Sheet for Patients:  BoilerBrush.com.cy Fact Sheet for Healthcare Providers: https://pope.com/ This test is not yet approved or cleared by the [place] FDA and has been authorized for detection and/or diagnosis of SARS-CoV-2 by FDA under an Emergency Use Authorization (EUA).  This EUA will remain in effect (meaning this test can be used) for the duration of the COVID-19 declaration under Section 564(b)(1) of the Act, 21 U.S.C. section 360bbb-3(b)(1), unless the authorization is terminated or revoked sooner. Performed at [facility], [address], [place], [place] [address]    Ct Abdomen Pelvis W Contrast  Result Date: 06/23/2019 CLINICAL DATA:  Generalized right left lower quadrant abdominal pain, guarding and rebound tenderness EXAM: CT ABDOMEN AND PELVIS WITH CONTRAST TECHNIQUE: Multidetector CT imaging of the abdomen and pelvis was performed  using the standard protocol following bolus administration of intravenous contrast. CONTRAST:  OMNIPAQUE IOHEXOL 300 MG/ML  SOLN COMPARISON:  None. FINDINGS: Lower chest: Mild basilar atelectatic changes. Normal heart size. No pericardial effusion. Upward paraesophageal herniation of omental/mesenteric fat through the diaphragmatic hiatus. Small amount of mediastinal gas is present, likely arising from the viscus perforation as below. Hepatobiliary: No focal liver abnormality is seen. No gallstones, gallbladder wall thickening, or biliary dilatation. Foci of gas along the anterior liver and within the falciform ligament and gallbladder fossa. Pancreas: Unremarkable. No pancreatic ductal dilatation or surrounding inflammatory changes. Spleen: Normal in size without focal abnormality. Adrenals/Urinary Tract: Adrenal glands are unremarkable. Kidneys are normal, without renal calculi, focal lesion, or hydronephrosis. Bladder is unremarkable. Stomach/Bowel: Diffuse colonic diverticulosis with focal segmental, circumferential thickening the proximal sigmoid colon and features of perforated diverticulitis centered upon a culprit diverticulum (2/87). There is a small amount of extraluminal fluid and numerous foci of extraluminal gas both adjacent and throughout the abdomen, as detailed above. Marked edematous thickening and inflammation of the adjacent epiploic appendages. There is likely reactive thickening of the adjacent small bowel loops with mild diffuse fluid distention which could reflect a reactive ileus. Vascular/Lymphatic: Trace atheromatous plaque within the normal caliber aorta. Reactive adenopathy in the mid abdomen Reproductive: The prostate and seminal vesicles are unremarkable. Other: Scattered foci of free intraperitoneal air throughout the abdomen and lower mediastinum, as detailed above. Likely arising from the site of perforated diverticulitis in the left lower quadrant. Reactive free fluid is seen  in the abdomen as well. Mild peritoneal thickening and enhancement in the left lower quadrant. Postsurgical changes from prior abdominal mesh anchor repair is noted in the left lower quadrant as well. Musculoskeletal: No acute osseous abnormality or suspicious osseous lesion. IMPRESSION: Findings compatible with a perforated diverticulitis with extraluminal gas and a trace amount of non organized extraluminal fluid. There is reactive inflammation of the adjacent epiploic appendages and small bowel loops coursing in close proximity as well as additional features of a reactive ileus. Peritoneal thickening and enhancement suggestive of peritonitis. Small amount of lower mediastinal gas likely related to the perforated appendicitis, appears contained within a  fat containing hiatal hernia. These results were called by telephone at the time of interpretation on 06/23/2019 at 11:17 pm to provider ALEXANDRA LAW , who verbally acknowledged these results. Electronically Signed   By: Lovena Le M.D.   On: 06/23/2019 23:18    Pending Labs [employer] (From admission, onward)    Start     Ordered   Signed and Held  HIV antibody (Routine Testing)  Once,   R     Signed and Held   Signed and Held  Basic metabolic panel  Tomorrow morning,   R     Signed and Held   Signed and Held  CBC  Tomorrow morning,   R     Signed and Held          Vitals/Pain Today's Vitals   06/23/19 1952 06/23/19 1957 06/23/19 2200 06/23/19 2300  BP: (!) 136/103  (!) 151/95 136/90  Pulse: (!) 126  82 89  Resp: 18  16 16   Temp: 98.5 F (36.9 C)     TempSrc: Oral     SpO2: 98%  95% 94%  Weight: 102.1 kg     Height: 5\' 10"  (1.778 m)     PainSc:  10-Worst pain ever      Isolation Precautions No active isolations  Medications Medications  sodium chloride flush (NS) 0.9 % injection 3 mL (has no administration in time range)  sodium chloride (PF) 0.9 % injection (has no administration in time range)  ceFEPIme (MAXIPIME) 2  g in sodium chloride 0.9 % 100 mL IVPB (0 g Intravenous Stopped 06/24/19 0005)    And  metroNIDAZOLE (FLAGYL) IVPB 500 mg (500 mg Intravenous New Bag/Given 06/24/19 0006)  sodium chloride 0.9 % bolus 1,000 mL (0 mLs Intravenous Stopped 06/23/19 2300)  morphine 4 MG/ML injection 4 mg (4 mg Intravenous Given 06/23/19 2132)  ondansetron (ZOFRAN) injection 4 mg (4 mg Intravenous Given 06/23/19 2132)  iohexol (OMNIPAQUE) 300 MG/ML solution 100 mL (100 mLs Intravenous Contrast Given 06/23/19 2228)    Mobility walks Low fall risk   Focused Assessments other, LLQ pain, intermittent nausea   R Recommendations: See Admitting Provider Note  Report given to:   Additional Notes:

## 2019-06-24 NOTE — Progress Notes (Signed)
Subjective/Chief Complaint: Seems ok. Pain is about the same. No fever and wbc normal   Objective: Vital signs in last 24 hours: Temp:  [98.4 F (36.9 C)-99.2 F (37.3 C)] 98.4 F (36.9 C) (09/13 0532) Pulse Rate:  [82-126] 82 (09/13 0532) Resp:  [16-18] 18 (09/13 0532) BP: (136-151)/(90-103) 137/93 (09/13 0532) SpO2:  [94 %-98 %] 96 % (09/13 0532) Weight:  [102.1 kg] 102.1 kg (09/12 1952) Last BM Date: 06/24/19  Intake/Output from previous day: 09/12 0701 - 09/13 0700 In: 2131.7 [I.V.:366.5; IV Piggyback:1765.2] Out: 300 [Urine:300] Intake/Output this shift: No intake/output data recorded.  General appearance: alert and cooperative Resp: clear to auscultation bilaterally Cardio: regular rate and rhythm GI: soft, moderate diffuse tenderness  Lab Results:  Recent Labs    06/23/19 2051 06/24/19 0407  WBC 11.8* 10.0  HGB 17.0 16.2  HCT 50.3 48.7  PLT 190 186   BMET Recent Labs    06/23/19 2051 06/24/19 0407  NA 138 139  K 3.5 3.7  CL 105 105  CO2 24 25  GLUCOSE 121* 128*  BUN 12 11  CREATININE 1.10 1.20  CALCIUM 9.4 8.8*   PT/INR No results for input(s): LABPROT, INR in the last 72 hours. ABG No results for input(s): PHART, HCO3 in the last 72 hours.  Invalid input(s): PCO2, PO2  Studies/Results: Ct Abdomen Pelvis W Contrast  Result Date: 06/23/2019 CLINICAL DATA:  Generalized right left lower quadrant abdominal pain, guarding and rebound tenderness EXAM: CT ABDOMEN AND PELVIS WITH CONTRAST TECHNIQUE: Multidetector CT imaging of the abdomen and pelvis was performed using the standard protocol following bolus administration of intravenous contrast. CONTRAST:  100mL OMNIPAQUE IOHEXOL 300 MG/ML  SOLN COMPARISON:  None. FINDINGS: Lower chest: Mild basilar atelectatic changes. Normal heart size. No pericardial effusion. Upward paraesophageal herniation of omental/mesenteric fat through the diaphragmatic hiatus. Small amount of mediastinal gas is present,  likely arising from the viscus perforation as below. Hepatobiliary: No focal liver abnormality is seen. No gallstones, gallbladder wall thickening, or biliary dilatation. Foci of gas along the anterior liver and within the falciform ligament and gallbladder fossa. Pancreas: Unremarkable. No pancreatic ductal dilatation or surrounding inflammatory changes. Spleen: Normal in size without focal abnormality. Adrenals/Urinary Tract: Adrenal glands are unremarkable. Kidneys are normal, without renal calculi, focal lesion, or hydronephrosis. Bladder is unremarkable. Stomach/Bowel: Diffuse colonic diverticulosis with focal segmental, circumferential thickening the proximal sigmoid colon and features of perforated diverticulitis centered upon a culprit diverticulum (2/87). There is a small amount of extraluminal fluid and numerous foci of extraluminal gas both adjacent and throughout the abdomen, as detailed above. Marked edematous thickening and inflammation of the adjacent epiploic appendages. There is likely reactive thickening of the adjacent small bowel loops with mild diffuse fluid distention which could reflect a reactive ileus. Vascular/Lymphatic: Trace atheromatous plaque within the normal caliber aorta. Reactive adenopathy in the mid abdomen Reproductive: The prostate and seminal vesicles are unremarkable. Other: Scattered foci of free intraperitoneal air throughout the abdomen and lower mediastinum, as detailed above. Likely arising from the site of perforated diverticulitis in the left lower quadrant. Reactive free fluid is seen in the abdomen as well. Mild peritoneal thickening and enhancement in the left lower quadrant. Postsurgical changes from prior abdominal mesh anchor repair is noted in the left lower quadrant as well. Musculoskeletal: No acute osseous abnormality or suspicious osseous lesion. IMPRESSION: Findings compatible with a perforated diverticulitis with extraluminal gas and a trace amount of non  organized extraluminal fluid. There is reactive inflammation of  the adjacent epiploic appendages and small bowel loops coursing in close proximity as well as additional features of a reactive ileus. Peritoneal thickening and enhancement suggestive of peritonitis. Small amount of lower mediastinal gas likely related to the perforated appendicitis, appears contained within a fat containing hiatal hernia. These results were called by telephone at the time of interpretation on 06/23/2019 at 11:17 pm to provider ALEXANDRA LAW , who verbally acknowledged these results. Electronically Signed   By: Lovena Le M.D.   On: 06/23/2019 23:18    Anti-infectives: Anti-infectives (From admission, onward)   Start     Dose/Rate Route Frequency Ordered Stop   06/24/19 0600  ceFEPIme (MAXIPIME) 2 g in sodium chloride 0.9 % 100 mL IVPB     2 g 200 mL/hr over 30 Minutes Intravenous Every 8 hours 06/24/19 0110     06/24/19 0600  metroNIDAZOLE (FLAGYL) IVPB 500 mg     500 mg 100 mL/hr over 60 Minutes Intravenous Every 8 hours 06/24/19 0110     06/23/19 2330  ceFEPIme (MAXIPIME) 2 g in sodium chloride 0.9 % 100 mL IVPB     2 g 200 mL/hr over 30 Minutes Intravenous  Once 06/23/19 2317 06/24/19 0005   06/23/19 2330  metroNIDAZOLE (FLAGYL) IVPB 500 mg     500 mg 100 mL/hr over 60 Minutes Intravenous  Once 06/23/19 2317 06/24/19 0106      Assessment/Plan: s/p * No surgery found * Continue bowel rest  Continue cefepime and flagyl Perforated diverticulitis Slow improvement  LOS: 0 days    Matthew Mack 06/24/2019

## 2019-06-24 NOTE — H&P (Signed)
Matthew Mack is an 53 y.o. male.   Chief Complaint: Abdominal pain HPI: The patient is a 53 year old white male who has a history of diverticulitis who developed left lower quadrant abdominal pain about 3 or 4 days ago.  The pain persisted so he went to an urgent care yesterday and got antibiotics.  The pain was worse today so he came to the emergency department where a CT scan does show sigmoid diverticulitis with several dots of free air but not a lot of free fluid.  His white count is 11,000 he has no fever or tachycardia or hypotension.  He is otherwise in good health.  Past Medical History:  Diagnosis Date  . Diverticulitis     Past Surgical History:  Procedure Laterality Date  . HERNIA REPAIR      Family History  Problem Relation Age of Onset  . Diabetes Mother    Social History:  reports that he has never smoked. He has never used smokeless tobacco. He reports previous alcohol use. He reports that he does not use drugs.  Allergies:  Allergies  Allergen Reactions  . Penicillins Hives  . Sulfa Antibiotics Itching    (Not in a hospital admission)   Results for orders placed or performed during the hospital encounter of 06/23/19 (from the past 48 hour(s))  Lipase, blood     Status: None   Collection Time: 06/23/19  8:51 PM  Result Value Ref Range   Lipase 22 11 - 51 U/L    Comment: Performed at [facility], [address] [place], [place] [address]  Comprehensive metabolic panel     Status: Abnormal   Collection Time: 06/23/19  8:51 PM  Result Value Ref Range   Sodium 138 135 - 145 mmol/L   Potassium 3.5 3.5 - 5.1 mmol/L   Chloride 105 98 - 111 mmol/L   CO2 24 22 - 32 mmol/L   Glucose, Bld 121 (H) 70 - 99 mg/dL   BUN 12 6 - 20 mg/dL   Creatinine, Ser 6.041.10 0.61 - 1.24 mg/dL   Calcium 9.4 8.9 - 54.010.3 mg/dL   Total Protein 8.4 (H) 6.5 - 8.1 g/dL   Albumin 4.6 3.5 - 5.0 g/dL   AST 18 15 - 41 U/L   ALT 31 0 - 44 U/L   Alkaline Phosphatase 64 38 -  126 U/L   Total Bilirubin 1.5 (H) 0.3 - 1.2 mg/dL   GFR calc non Af Amer >60 >60 mL/min   GFR calc Af Amer >60 >60 mL/min   Anion gap 9 5 - 15    Comment: Performed at [facility], [address] [place], [place] [address]  CBC     Status: Abnormal   Collection Time: 06/23/19  8:51 PM  Result Value Ref Range   WBC 11.8 (H) 4.0 - 10.5 K/uL   RBC 5.40 4.22 - 5.81 MIL/uL   Hemoglobin 17.0 13.0 - 17.0 g/dL   HCT 14.750.3 82.939.0 - 56.252.0 %   MCV 93.1 80.0 - 100.0 fL   MCH 31.5 26.0 - 34.0 pg   MCHC 33.8 30.0 - 36.0 g/dL   RDW 13.012.7 86.511.5 - 78.415.5 %   Platelets 190 150 - 400 K/uL   nRBC 0.0 0.0 - 0.2 %    Comment: Performed at [facility], [address] [place], [place] [address]  Urinalysis, Routine w reflex microscopic     Status: Abnormal   Collection Time: 06/23/19  8:51 PM  Result Value Ref Range   Color, Urine YELLOW YELLOW   APPearance HAZY (A) CLEAR   Specific Gravity, Urine 1.020 1.005 - 1.030   pH 5.0 5.0 - 8.0   Glucose, UA NEGATIVE NEGATIVE mg/dL   Hgb urine dipstick NEGATIVE NEGATIVE   Bilirubin Urine NEGATIVE NEGATIVE   Ketones, ur 80 (A) NEGATIVE mg/dL   Protein, ur 30 (A) NEGATIVE mg/dL   Nitrite NEGATIVE NEGATIVE   Leukocytes,Ua TRACE (A) NEGATIVE   RBC / HPF 0-5 0 - 5 RBC/hpf   WBC, UA 0-5 0 - 5 WBC/hpf   Bacteria, UA NONE SEEN NONE SEEN   Mucus PRESENT     Comment: Performed at [facility], [address], [place], [place] [address]  Lactic acid, plasma     Status: None   Collection Time: 06/23/19  9:33 PM  Result Value Ref Range   Lactic Acid, Venous 1.0 0.5 - 1.9 mmol/L    Comment: Performed at [facility], [address] [place], [place] [address]  SARS Coronavirus 2 [facility] order, Performed in [facility] hospital lab) Nasopharyngeal Nasopharyngeal Swab     Status: None   Collection Time: 06/23/19  9:33 PM   Specimen: Nasopharyngeal Swab  Result Value Ref Range   SARS Coronavirus  2 NEGATIVE NEGATIVE    Comment: (NOTE) If result is NEGATIVE SARS-CoV-2 target nucleic acids are NOT DETECTED. The SARS-CoV-2 RNA is generally detectable in upper and lower  respiratory specimens during the acute phase of infection. The lowest  concentration of SARS-CoV-2 viral copies this assay can detect is 250  copies / mL. A negative result does not preclude SARS-CoV-2 infection  and should not be used as the sole basis for treatment or other  patient management decisions.  A negative result may occur with  improper specimen collection / handling, submission of specimen other  than nasopharyngeal swab, presence of viral mutation(s) within the  areas targeted by this assay, and inadequate number of viral copies  (<250 copies / mL). A negative result must be combined with clinical  observations, patient history, and epidemiological information. If result is POSITIVE SARS-CoV-2 target nucleic acids are DETECTED. The SARS-CoV-2 RNA is generally detectable in upper and lower  respiratory specimens dur ing the acute phase of infection.  Positive  results are indicative of active infection with SARS-CoV-2.  Clinical  correlation with patient history and other diagnostic information is  necessary to determine patient infection status.  Positive results do  not rule out bacterial infection or co-infection with other viruses. If result is PRESUMPTIVE POSTIVE SARS-CoV-2 nucleic acids MAY BE PRESENT.   A presumptive positive result was obtained on the submitted specimen  and confirmed on repeat testing.  While 2019 novel coronavirus  (SARS-CoV-2) nucleic acids may be present in the submitted sample  additional confirmatory testing may be necessary for epidemiological  and / or clinical management purposes  to differentiate between  SARS-CoV-2 and other Sarbecovirus currently known to infect humans.  If clinically indicated additional testing with an alternate test  methodology [phone]) is  advised. The SARS-CoV-2 RNA is generally  detectable in upper and lower respiratory sp ecimens during the acute  phase of infection. The expected result is Negative. Fact Sheet for Patients:  BoilerBrush.com.cy Fact Sheet for Healthcare Providers: https://pope.com/ This test is not yet approved or cleared by the [place] FDA and has been authorized for detection and/or diagnosis of SARS-CoV-2 by FDA under an Emergency Use Authorization (EUA).  This  EUA will remain in effect (meaning this test can be used) for the duration of the COVID-19 declaration under Section 564(b)(1) of the Act, 21 U.S.C. section 360bbb-3(b)(1), unless the authorization is terminated or revoked sooner. Performed at [facility], [address] [place], [place] [address]    Ct Abdomen Pelvis W Contrast  Result Date: 06/23/2019 CLINICAL DATA:  Generalized right left lower quadrant abdominal pain, guarding and rebound tenderness EXAM: CT ABDOMEN AND PELVIS WITH CONTRAST TECHNIQUE: Multidetector CT imaging of the abdomen and pelvis was performed using the standard protocol following bolus administration of intravenous contrast. CONTRAST:  100mL OMNIPAQUE IOHEXOL 300 MG/ML  SOLN COMPARISON:  None. FINDINGS: Lower chest: Mild basilar atelectatic changes. Normal heart size. No pericardial effusion. Upward paraesophageal herniation of omental/mesenteric fat through the diaphragmatic hiatus. Small amount of mediastinal gas is present, likely arising from the viscus perforation as below. Hepatobiliary: No focal liver abnormality is seen. No gallstones, gallbladder wall thickening, or biliary dilatation. Foci of gas along the anterior liver and within the falciform ligament and gallbladder fossa. Pancreas: Unremarkable. No pancreatic ductal dilatation or surrounding inflammatory changes. Spleen: Normal in size without focal abnormality. Adrenals/Urinary Tract:  Adrenal glands are unremarkable. Kidneys are normal, without renal calculi, focal lesion, or hydronephrosis. Bladder is unremarkable. Stomach/Bowel: Diffuse colonic diverticulosis with focal segmental, circumferential thickening the proximal sigmoid colon and features of perforated diverticulitis centered upon a culprit diverticulum (2/87). There is a small amount of extraluminal fluid and numerous foci of extraluminal gas both adjacent and throughout the abdomen, as detailed above. Marked edematous thickening and inflammation of the adjacent epiploic appendages. There is likely reactive thickening of the adjacent small bowel loops with mild diffuse fluid distention which could reflect a reactive ileus. Vascular/Lymphatic: Trace atheromatous plaque within the normal caliber aorta. Reactive adenopathy in the mid abdomen Reproductive: The prostate and seminal vesicles are unremarkable. Other: Scattered foci of free intraperitoneal air throughout the abdomen and lower mediastinum, as detailed above. Likely arising from the site of perforated diverticulitis in the left lower quadrant. Reactive free fluid is seen in the abdomen as well. Mild peritoneal thickening and enhancement in the left lower quadrant. Postsurgical changes from prior abdominal mesh anchor repair is noted in the left lower quadrant as well. Musculoskeletal: No acute osseous abnormality or suspicious osseous lesion. IMPRESSION: Findings compatible with a perforated diverticulitis with extraluminal gas and a trace amount of non organized extraluminal fluid. There is reactive inflammation of the adjacent epiploic appendages and small bowel loops coursing in close proximity as well as additional features of a reactive ileus. Peritoneal thickening and enhancement suggestive of peritonitis. Small amount of lower mediastinal gas likely related to the perforated appendicitis, appears contained within a fat containing hiatal hernia. These results were called  by telephone at the time of interpretation on 06/23/2019 at 11:17 pm to provider ALEXANDRA LAW , who verbally acknowledged these results. Electronically Signed   By: Kreg ShropshirePrice  DeHay M.D.   On: 06/23/2019 23:18    Review of Systems  Constitutional: Negative.   HENT: Negative.   Eyes: Negative.   Respiratory: Negative.   Cardiovascular: Negative.   Gastrointestinal: Positive for abdominal pain.  Genitourinary: Negative.   Musculoskeletal: Negative.   Skin: Negative.   Neurological: Negative.   Endo/Heme/Allergies: Negative.   Psychiatric/Behavioral: Negative.     Blood pressure 136/90, pulse 89, temperature 98.5 F (36.9 C), temperature source Oral, resp. rate 16, height 5\' 10"  (1.778 m), weight 102.1 kg, SpO2 94 %. Physical Exam  Constitutional:  He is oriented to person, place, and time. He appears well-developed and well-nourished. No distress.  HENT:  Head: Normocephalic and atraumatic.  Mouth/Throat: No oropharyngeal exudate.  Eyes: Pupils are equal, round, and reactive to light. Conjunctivae and EOM are normal.  Neck: Normal range of motion. Neck supple.  Cardiovascular: Normal rate, regular rhythm and normal heart sounds.  Respiratory: Effort normal and breath sounds normal. No stridor. No respiratory distress.  GI: Soft.  There is moderate diffuse tenderness worse in the left lower quadrant.  Although he has guarding the abdomen outside of the left lower quadrant is relatively soft  Musculoskeletal: Normal range of motion.        General: No tenderness or edema.  Neurological: He is alert and oriented to person, place, and time. Coordination normal.  Skin: Skin is warm and dry. No rash noted.  Psychiatric: He has a normal mood and affect. His behavior is normal. Thought content normal.     Assessment/Plan The patient appears to have perforated sigmoid diverticulitis.  I have discussed with him the options for treatment which include emergent surgery with colostomy versus bowel  rest and antibiotics with possible definitive surgery in 6 to 8 weeks if he gets better.  He would prefer avoiding the ostomy if at all possible.  I think it is reasonable given that he does not appear sick to give him a chance at this.  We will start him on broad-spectrum antibiotic therapy and bowel rest and watch him very closely.  If he worsens then he may have to have the emergent surgery.  He understands and agrees with this treatment plan.  Autumn Messing III, MD 06/24/2019, 12:02 AM

## 2019-06-25 MED ORDER — HYDROCODONE-ACETAMINOPHEN 5-325 MG PO TABS
1.0000 | ORAL_TABLET | Freq: Four times a day (QID) | ORAL | Status: DC | PRN
  Filled 2019-06-25: qty 2

## 2019-06-25 MED ORDER — OXYCODONE HCL 5 MG PO TABS
5.0000 mg | ORAL_TABLET | ORAL | Status: DC | PRN
  Administered 2019-06-25 – 2019-06-27 (×3): 5 mg via ORAL
  Filled 2019-06-25 (×3): qty 1

## 2019-06-25 MED ORDER — ENOXAPARIN SODIUM 40 MG/0.4ML ~~LOC~~ SOLN
40.0000 mg | Freq: Every day | SUBCUTANEOUS | Status: DC
  Administered 2019-06-25 – 2019-06-29 (×5): 40 mg via SUBCUTANEOUS
  Filled 2019-06-25 (×5): qty 0.4

## 2019-06-25 MED ORDER — PROMETHAZINE HCL 25 MG/ML IJ SOLN
25.0000 mg | Freq: Four times a day (QID) | INTRAMUSCULAR | Status: DC | PRN
  Administered 2019-06-25: 25 mg via INTRAVENOUS
  Filled 2019-06-25: qty 1

## 2019-06-25 MED ORDER — MORPHINE SULFATE (PF) 2 MG/ML IV SOLN: 2.0000 mg | INTRAVENOUS | Status: DC | PRN

## 2019-06-25 MED ORDER — ACETAMINOPHEN 325 MG PO TABS
650.0000 mg | ORAL_TABLET | Freq: Four times a day (QID) | ORAL | Status: DC
  Administered 2019-06-25 – 2019-06-26 (×5): 650 mg via ORAL
  Filled 2019-06-25 (×9): qty 2

## 2019-06-25 NOTE — Progress Notes (Signed)
[facility] Progress Note      Assessment/Plan History of diverticulitis  Perforated sigmoid diverticulitis - Continue bowel rest, IV antibiotics, IV fluids - Patient is nontoxic-appearing, no peritonitis on exam, and improved from yesterday - Ideally we will try to avoid emergent surgery but if patient worsens he will need Hartman's procedure  FEN: N.p.o., ice chips, IV fluids VTE: SCD's, lovenox ID: Maxipime and Flagyl 09/13>>, afebrile, WBC 10.0 Foley: None Follow up: TBD    LOS: 1 day    Subjective: CC: Abdominal pain  Pain greatly improved from yesterday.  No nausea vomiting, fever or chills overnight.  Patient still having pain with flatus and BMs.  BM this morning without blood.  Endorses bloating.  Objective: Vital signs in last 24 hours: Temp:  [98.6 F (37 C)-99.5 F (37.5 C)] 98.8 F (37.1 C) (09/14 0800) Pulse Rate:  [77-87] 77 (09/14 0800) Resp:  [18] 18 (09/14 0800) BP: (126-139)/(81-99) 126/88 (09/14 0800) SpO2:  [95 %-97 %] 97 % (09/14 0800) Last BM Date: 06/25/19  Intake/Output from previous day: 09/13 0701 - 09/14 0700 In: 2430.9 [I.V.:2130.9; IV Piggyback:300] Out: 1501 [Urine:1501] Intake/Output this shift: Total I/O In: 400 [I.V.:400] Out: -   PE: Gen:  Alert, NAD, pleasant, cooperative, nontoxic-appearing Card:  RRR, no M/G/R heard Pulm:  CTA, no W/R/R, rate and effort normal Abd: Soft, ND, +BS, no HSM, TTP of lower abdomen with guarding.  No peritonitis. no hernias noted Skin: no rashes noted, warm and dry   Anti-infectives: Anti-infectives (From admission, onward)   Start     Dose/Rate Route Frequency Ordered Stop   06/24/19 0600  ceFEPIme (MAXIPIME) 2 g in sodium chloride 0.9 % 100 mL IVPB     2 g 200 mL/hr over 30 Minutes Intravenous Every 8 hours 06/24/19 0110     06/24/19 0600  metroNIDAZOLE (FLAGYL) IVPB 500 mg     500 mg 100 mL/hr over 60 Minutes Intravenous Every 8 hours 06/24/19 0110     06/23/19  2330  ceFEPIme (MAXIPIME) 2 g in sodium chloride 0.9 % 100 mL IVPB     2 g 200 mL/hr over 30 Minutes Intravenous  Once 06/23/19 2317 06/24/19 0005   06/23/19 2330  metroNIDAZOLE (FLAGYL) IVPB 500 mg     500 mg 100 mL/hr over 60 Minutes Intravenous  Once 06/23/19 2317 06/24/19 0106      Lab Results:  Recent Labs    06/23/19 2051 06/24/19 0407  WBC 11.8* 10.0  HGB 17.0 16.2  HCT 50.3 48.7  PLT 190 186   BMET Recent Labs    06/23/19 2051 06/24/19 0407  NA 138 139  K 3.5 3.7  CL 105 105  CO2 24 25  GLUCOSE 121* 128*  BUN 12 11  CREATININE 1.10 1.20  CALCIUM 9.4 8.8*   PT/INR No results for input(s): LABPROT, INR in the last 72 hours. CMP     Component Value Date/Time   NA 139 06/24/2019 0407   K 3.7 06/24/2019 0407   CL 105 06/24/2019 0407   CO2 25 06/24/2019 0407   GLUCOSE 128 (H) 06/24/2019 0407   BUN 11 06/24/2019 0407   CREATININE 1.20 06/24/2019 0407   CALCIUM 8.8 (L) 06/24/2019 0407   PROT 8.4 (H) 06/23/2019 2051   ALBUMIN 4.6 06/23/2019 2051   AST 18 06/23/2019 2051   ALT 31 06/23/2019 2051   ALKPHOS 64 06/23/2019 2051   BILITOT 1.5 (H) 06/23/2019 2051   GFRNONAA >60 06/24/2019 0407   GFRAA >  60 06/24/2019 0407   Lipase     Component Value Date/Time   LIPASE 22 06/23/2019 2051    Studies/Results: Ct Abdomen Pelvis W Contrast  Result Date: 06/23/2019 CLINICAL DATA:  Generalized right left lower quadrant abdominal pain, guarding and rebound tenderness EXAM: CT ABDOMEN AND PELVIS WITH CONTRAST TECHNIQUE: Multidetector CT imaging of the abdomen and pelvis was performed using the standard protocol following bolus administration of intravenous contrast. CONTRAST:  163mL OMNIPAQUE IOHEXOL 300 MG/ML  SOLN COMPARISON:  None. FINDINGS: Lower chest: Mild basilar atelectatic changes. Normal heart size. No pericardial effusion. Upward paraesophageal herniation of omental/mesenteric fat through the diaphragmatic hiatus. Small amount of mediastinal gas is present,  likely arising from the viscus perforation as below. Hepatobiliary: No focal liver abnormality is seen. No gallstones, gallbladder wall thickening, or biliary dilatation. Foci of gas along the anterior liver and within the falciform ligament and gallbladder fossa. Pancreas: Unremarkable. No pancreatic ductal dilatation or surrounding inflammatory changes. Spleen: Normal in size without focal abnormality. Adrenals/Urinary Tract: Adrenal glands are unremarkable. Kidneys are normal, without renal calculi, focal lesion, or hydronephrosis. Bladder is unremarkable. Stomach/Bowel: Diffuse colonic diverticulosis with focal segmental, circumferential thickening the proximal sigmoid colon and features of perforated diverticulitis centered upon a culprit diverticulum (2/87). There is a small amount of extraluminal fluid and numerous foci of extraluminal gas both adjacent and throughout the abdomen, as detailed above. Marked edematous thickening and inflammation of the adjacent epiploic appendages. There is likely reactive thickening of the adjacent small bowel loops with mild diffuse fluid distention which could reflect a reactive ileus. Vascular/Lymphatic: Trace atheromatous plaque within the normal caliber aorta. Reactive adenopathy in the mid abdomen Reproductive: The prostate and seminal vesicles are unremarkable. Other: Scattered foci of free intraperitoneal air throughout the abdomen and lower mediastinum, as detailed above. Likely arising from the site of perforated diverticulitis in the left lower quadrant. Reactive free fluid is seen in the abdomen as well. Mild peritoneal thickening and enhancement in the left lower quadrant. Postsurgical changes from prior abdominal mesh anchor repair is noted in the left lower quadrant as well. Musculoskeletal: No acute osseous abnormality or suspicious osseous lesion. IMPRESSION: Findings compatible with a perforated diverticulitis with extraluminal gas and a trace amount of non  organized extraluminal fluid. There is reactive inflammation of the adjacent epiploic appendages and small bowel loops coursing in close proximity as well as additional features of a reactive ileus. Peritoneal thickening and enhancement suggestive of peritonitis. Small amount of lower mediastinal gas likely related to the perforated appendicitis, appears contained within a fat containing hiatal hernia. These results were called by telephone at the time of interpretation on 06/23/2019 at 11:17 pm to provider ALEXANDRA LAW , who verbally acknowledged these results. Electronically Signed   By: Lovena Le M.D.   On: 06/23/2019 23:18      Kalman Drape , [facility] 06/25/2019, 11:01 AM  Pager: [phone] Mon-Wed, Friday 7:00am-4:30pm Thurs 7am-11:30am  Consults: [phone]

## 2019-06-26 LAB — CBC
HCT: 43.9 % (ref 39.0–52.0)
Hemoglobin: 14.1 g/dL (ref 13.0–17.0)
MCH: 31.2 pg (ref 26.0–34.0)
MCHC: 32.1 g/dL (ref 30.0–36.0)
MCV: 97.1 fL (ref 80.0–100.0)
Platelets: 204 10*3/uL (ref 150–400)
RBC: 4.52 MIL/uL (ref 4.22–5.81)
RDW: 12.9 % (ref 11.5–15.5)
WBC: 5.6 10*3/uL (ref 4.0–10.5)
nRBC: 0 % (ref 0.0–0.2)

## 2019-06-26 NOTE — Progress Notes (Signed)
[facility] Progress Note      Assessment/Plan History of diverticulitis  Perforated sigmoid diverticulitis - Continue bowel rest, IV antibiotics, IV fluids - Patient is nontoxic-appearing, no peritonitis on exam, and improved from yesterday - Ideally we will try to avoid emergent surgery but if patient worsens he will need Hartman's procedure - Repeat CT scan on Thursday   FEN:  CLD, IV fluids VTE: SCD's, lovenox ID: Maxipime and Flagyl 09/13>>, afebrile, WBC 5.6 Foley: None Follow up: TBD   LOS: 2 days    Subjective: CC: Abdominal pain and nausea  Patient states nausea overnight he thinks is 2/2 pain medicine.  No vomiting.  Patient had a BM this morning without increase in pain.  He states overall his abdominal pain is improved.  No fever or chills overnight.  Objective: Vital signs in last 24 hours: Temp:  [97.7 F (36.5 C)-98.1 F (36.7 C)] 98.1 F (36.7 C) (09/15 0636) Pulse Rate:  [59-73] 59 (09/15 0636) Resp:  [16-18] 18 (09/15 0636) BP: (124-131)/(84-87) 124/84 (09/15 0636) SpO2:  [95 %-97 %] 97 % (09/15 0636) Last BM Date: 06/25/19  Intake/Output from previous day: 09/14 0701 - 09/15 0700 In: 1400 [I.V.:1200; IV Piggyback:200] Out: 1 [Urine:1] Intake/Output this shift: No intake/output data recorded.  PE: Gen:  Alert, NAD, pleasant, cooperative, nontoxic-appearing Card:  RRR, no M/G/R heard Pulm:  CTA, no W/R/R, rate and effort normal Abd: Soft, ND, +BS, no HSM,  improved TTP of lower abdomen with guarding.  No peritonitis. no hernias noted Skin: no rashes noted, warm and dry  Anti-infectives: Anti-infectives (From admission, onward)   Start     Dose/Rate Route Frequency Ordered Stop   06/24/19 0600  ceFEPIme (MAXIPIME) 2 g in sodium chloride 0.9 % 100 mL IVPB     2 g 200 mL/hr over 30 Minutes Intravenous Every 8 hours 06/24/19 0110     06/24/19 0600  metroNIDAZOLE (FLAGYL) IVPB 500 mg     500 mg 100 mL/hr over 60 Minutes  Intravenous Every 8 hours 06/24/19 0110     06/23/19 2330  ceFEPIme (MAXIPIME) 2 g in sodium chloride 0.9 % 100 mL IVPB     2 g 200 mL/hr over 30 Minutes Intravenous  Once 06/23/19 2317 06/24/19 0005   06/23/19 2330  metroNIDAZOLE (FLAGYL) IVPB 500 mg     500 mg 100 mL/hr over 60 Minutes Intravenous  Once 06/23/19 2317 06/24/19 0106      Lab Results:  Recent Labs    06/24/19 0407 06/26/19 0320  WBC 10.0 5.6  HGB 16.2 14.1  HCT 48.7 43.9  PLT 186 204   BMET Recent Labs    06/23/19 2051 06/24/19 0407  NA 138 139  K 3.5 3.7  CL 105 105  CO2 24 25  GLUCOSE 121* 128*  BUN 12 11  CREATININE 1.10 1.20  CALCIUM 9.4 8.8*   PT/INR No results for input(s): LABPROT, INR in the last 72 hours. CMP     Component Value Date/Time   NA 139 06/24/2019 0407   K 3.7 06/24/2019 0407   CL 105 06/24/2019 0407   CO2 25 06/24/2019 0407   GLUCOSE 128 (H) 06/24/2019 0407   BUN 11 06/24/2019 0407   CREATININE 1.20 06/24/2019 0407   CALCIUM 8.8 (L) 06/24/2019 0407   PROT 8.4 (H) 06/23/2019 2051   ALBUMIN 4.6 06/23/2019 2051   AST 18 06/23/2019 2051   ALT 31 06/23/2019 2051   ALKPHOS 64 06/23/2019 2051   BILITOT 1.5 (  H) 06/23/2019 2051   GFRNONAA >60 06/24/2019 0407   GFRAA >60 06/24/2019 0407   Lipase     Component Value Date/Time   LIPASE 22 06/23/2019 2051    Studies/Results: No results found.    Kalman Drape , [facility] 06/26/2019, 8:33 AM  Pager: [phone] Mon-Wed, Friday 7:00am-4:30pm Thurs 7am-11:30am  Consults: [phone]

## 2019-06-27 LAB — CBC
HCT: 43.3 % (ref 39.0–52.0)
Hemoglobin: 13.9 g/dL (ref 13.0–17.0)
MCH: 31.1 pg (ref 26.0–34.0)
MCHC: 32.1 g/dL (ref 30.0–36.0)
MCV: 96.9 fL (ref 80.0–100.0)
Platelets: 221 10*3/uL (ref 150–400)
RBC: 4.47 MIL/uL (ref 4.22–5.81)
RDW: 12.6 % (ref 11.5–15.5)
WBC: 5.6 10*3/uL (ref 4.0–10.5)
nRBC: 0 % (ref 0.0–0.2)

## 2019-06-27 MED ORDER — IBUPROFEN 200 MG PO TABS: 600.0000 mg | ORAL_TABLET | Freq: Four times a day (QID) | ORAL | Status: DC | PRN

## 2019-06-27 MED ORDER — IOHEXOL 300 MG/ML  SOLN: 30.0000 mL | Freq: Once | INTRAMUSCULAR | Status: DC | PRN

## 2019-06-27 MED ORDER — METHOCARBAMOL 500 MG PO TABS
500.0000 mg | ORAL_TABLET | Freq: Four times a day (QID) | ORAL | Status: DC | PRN
  Administered 2019-06-28: 500 mg via ORAL
  Filled 2019-06-27: qty 1

## 2019-06-27 NOTE — Progress Notes (Signed)
[facility]/Trauma Progress Note      Assessment/Plan History of diverticulitis  Perforated sigmoid diverticulitis -Continue bowel rest, IV antibiotics, IV fluids -Patient is nontoxic-appearing, no peritonitis on exam, -Ideally we will try to avoid emergent surgery but if patient worsens he will need Hartman's procedure - Repeat CT scan tomorrow  FEN: CLD, IV fluids, n.p.o. at midnight VTE: SCD's, lovenox FM:[id number] and Flagyl09/13>>,afebrile, WBC 5.6 Foley:None Follow up:TBD   LOS: 3 days    Subjective: CC: Headache  Patient is having bowel function with minimal pain.  He had some nausea overnight but no vomiting.  No fever or chills.  Patient states chronic headaches.  Objective: Vital signs in last 24 hours: Temp:  [97.9 F (36.6 C)-98.8 F (37.1 C)] 98.5 F (36.9 C) (09/16 0555) Pulse Rate:  [59-63] 61 (09/16 0555) Resp:  [18] 18 (09/16 0555) BP: (124-140)/(90-94) 131/94 (09/16 0555) SpO2:  [97 %-98 %] 97 % (09/16 0555) Last BM Date: 06/26/19  Intake/Output from previous day: 09/15 0701 - 09/16 0700 In: 3857.9 [P.O.:1080; I.V.:2077.9; IV Piggyback:700] Out: 600 [Urine:600] Intake/Output this shift: No intake/output data recorded.  PE: Gen: Alert, NAD, pleasant, cooperative,nontoxic-appearing Card: RRR, no M/G/R heard Pulm: CTA, no W/R/R, rate andeffort normal Abd: Soft, ND, +BS, no HSM, TTP of lower abdomen with guarding. No peritonitis.no hernias noted Skin: no rashes noted, warm and dry  Anti-infectives: Anti-infectives (From admission, onward)   Start     Dose/Rate Route Frequency Ordered Stop   06/24/19 0600  ceFEPIme (MAXIPIME) 2 g in sodium chloride 0.9 % 100 mL IVPB     2 g 200 mL/hr over 30 Minutes Intravenous Every 8 hours 06/24/19 0110     06/24/19 0600  metroNIDAZOLE (FLAGYL) IVPB 500 mg     500 mg 100 mL/hr over 60 Minutes Intravenous Every 8 hours 06/24/19 0110     06/23/19 2330  ceFEPIme (MAXIPIME) 2 g  in sodium chloride 0.9 % 100 mL IVPB     2 g 200 mL/hr over 30 Minutes Intravenous  Once 06/23/19 2317 06/24/19 0005   06/23/19 2330  metroNIDAZOLE (FLAGYL) IVPB 500 mg     500 mg 100 mL/hr over 60 Minutes Intravenous  Once 06/23/19 2317 06/24/19 0106      Lab Results:  Recent Labs    06/26/19 0320 06/27/19 0253  WBC 5.6 5.6  HGB 14.1 13.9  HCT 43.9 43.3  PLT 204 221   BMET No results for input(s): NA, K, CL, CO2, GLUCOSE, BUN, CREATININE, CALCIUM in the last 72 hours. PT/INR No results for input(s): LABPROT, INR in the last 72 hours. CMP     Component Value Date/Time   NA 139 06/24/2019 0407   K 3.7 06/24/2019 0407   CL 105 06/24/2019 0407   CO2 25 06/24/2019 0407   GLUCOSE 128 (H) 06/24/2019 0407   BUN 11 06/24/2019 0407   CREATININE 1.20 06/24/2019 0407   CALCIUM 8.8 (L) 06/24/2019 0407   PROT 8.4 (H) 06/23/2019 2051   ALBUMIN 4.6 06/23/2019 2051   AST 18 06/23/2019 2051   ALT 31 06/23/2019 2051   ALKPHOS 64 06/23/2019 2051   BILITOT 1.5 (H) 06/23/2019 2051   GFRNONAA >60 06/24/2019 0407   GFRAA >60 06/24/2019 0407   Lipase     Component Value Date/Time   LIPASE 22 06/23/2019 2051    Studies/Results: No results found.    Kalman Drape , [facility] Surgery 06/27/2019, 9:49 AM  Pager: [phone] Mon-Wed, Friday 7:00am-4:30pm Thurs 7am-11:30am  Consults: [phone]

## 2019-06-28 ENCOUNTER — Inpatient Hospital Stay (HOSPITAL_COMMUNITY): Payer: BC Managed Care – PPO

## 2019-06-28 LAB — CBC
HCT: 42.2 % (ref 39.0–52.0)
Hemoglobin: 13.6 g/dL (ref 13.0–17.0)
MCH: 31 pg (ref 26.0–34.0)
MCHC: 32.2 g/dL (ref 30.0–36.0)
MCV: 96.1 fL (ref 80.0–100.0)
Platelets: 216 10*3/uL (ref 150–400)
RBC: 4.39 MIL/uL (ref 4.22–5.81)
RDW: 12.4 % (ref 11.5–15.5)
WBC: 5 10*3/uL (ref 4.0–10.5)
nRBC: 0 % (ref 0.0–0.2)

## 2019-06-28 MED ORDER — SODIUM CHLORIDE (PF) 0.9 % IJ SOLN
INTRAMUSCULAR | Status: AC
  Filled 2019-06-28: qty 50

## 2019-06-28 MED ORDER — IOHEXOL 300 MG/ML  SOLN
100.0000 mL | Freq: Once | INTRAMUSCULAR | Status: AC | PRN
  Administered 2019-06-28: 100 mL via INTRAVENOUS

## 2019-06-28 MED ORDER — IOHEXOL 300 MG/ML  SOLN
30.0000 mL | Freq: Once | INTRAMUSCULAR | Status: AC | PRN
  Administered 2019-06-28: 30 mL via ORAL

## 2019-06-28 NOTE — Discharge Instructions (Signed)
Diverticulitis ° °Diverticulitis is when small pockets in your large intestine (colon) get infected or swollen. This causes stomach pain and watery poop (diarrhea). °These pouches are called diverticula. They form in people who have a condition called diverticulosis. °Follow these instructions at home: °Medicines °· Take over-the-counter and prescription medicines only as told by your doctor. These include: °? Antibiotics. °? Pain medicines. °? Fiber pills. °? Probiotics. °? Stool softeners. °· Do not drive or use heavy machinery while taking prescription pain medicine. °· If you were prescribed an antibiotic, take it as told. Do not stop taking it even if you feel better. °General instructions ° °· Follow a diet as told by your doctor. °· When you feel better, your doctor may tell you to change your diet. You may need to eat a lot of fiber. Fiber makes it easier to poop (have bowel movements). Healthy foods with fiber include: °? Berries. °? Beans. °? Lentils. °? Green vegetables. °· Exercise 3 or more times a week. Aim for 30 minutes each time. Exercise enough to sweat and make your heart beat faster. °· Keep all follow-up visits as told. This is important. You may need to have an exam of the large intestine. This is called a colonoscopy. °Contact a doctor if: °· Your pain does not get better. °· You have a hard time eating or drinking. °· You are not pooping like normal. °Get help right away if: °· Your pain gets worse. °· Your problems do not get better. °· Your problems get worse very fast. °· You have a fever. °· You throw up (vomit) more than one time. °· You have poop that is: °? Bloody. °? Black. °? Tarry. °Summary °· Diverticulitis is when small pockets in your large intestine (colon) get infected or swollen. °· Take medicines only as told by your doctor. °· Follow a diet as told by your doctor. °This information is not intended to replace advice given to you by your health care provider. Make sure you  discuss any questions you have with your health care provider. °Document Released: 03/15/2008 Document Revised: 09/09/2017 Document Reviewed: 10/14/2016 °Elsevier Patient Education © 2020 [employer] ° ° ° °Low-Fiber Eating Plan °Fiber is found in fruits, vegetables, whole grains, and beans. Eating a diet low in fiber helps to reduce how often you have bowel movements and how much you produce during a bowel movement. A low-fiber eating plan may help your digestive system heal if: °· You have certain conditions, such as Crohn's disease or diverticulitis. °· You recently had radiation therapy on your pelvis or bowel. °· You recently had intestinal surgery. °· You have a new surgical opening in your abdomen (colostomy or ileostomy). °· Your intestine is narrowed (stricture). °Your health care provider will determine how long you need to stay on this diet. Your health care provider may recommend that you work with a diet and nutrition specialist (dietitian). °What are tips for following this plan? °General guidelines °· Follow recommendations from your dietitian about how much fiber you should have each day. °· Most people on this eating plan should try to eat less than 10 grams (g) of fiber each day. Your daily fiber goal is _________________ g. °· Take vitamin and mineral supplements as told by your health care provider or dietitian. Chewable or liquid forms are best when on this eating plan. °Reading food labels °· Check food labels for the amount of dietary fiber. °· Choose foods that have less than 2 grams of   fiber in one serving. °Cooking °· Use white flour and other allowed grains for baking and cooking. °· Cook meat using methods that keep it tender, such as braising or poaching. °· Cook eggs until the yolk is completely solid. °· Cook with healthy oils, such as olive oil or canola oil. °Meal planning ° °· Eat 5-6 small meals throughout the day instead of 3 large meals. °· If you are lactose  intolerant: °? Choose low-lactose dairy foods. °? Do not eat dairy foods, if told by your dietitian. °· Limit fat and oils to less than 8 teaspoons a day. °· Eat small portions of desserts. °What foods are allowed? °The items listed below may not be a complete list. Talk with your dietitian about what dietary choices are best for you. °Grains °All bread and crackers made with white flour. Waffles, pancakes, and French toast. Bagels. Pretzels. Melba toast, zwieback, and matzoh. Cooked and dried cereals that do not contain whole grains, added fiber, seeds, or dried fruit. Cornmeal. Farina. Hot and cold cereals made with refined corn, wheat, rice, or oats. Plain pasta and noodles. White rice. °Vegetables °Well-cooked or canned vegetables without skin, seeds, or stems. Cooked potatoes without skins. Vegetable juice. °Fruits °Soft-cooked or canned fruits without skin and seeds. Peeled ripe banana. Applesauce. Fruit juice without pulp. °Meats and other protein foods °Ground meat. Tender cuts of meat or poultry. Eggs. Fish, seafood, and shellfish. Smooth nut butters. Tofu. °Dairy °All milk products and drinks. Lactose-free milks, including rice, soy, and almond milks. Yogurt without fruit, nuts, chocolate, or granola mix-ins. Sour cream. Cottage cheese. Cheese. °Beverages °Decaf coffee. Fruit and vegetable juices or smoothies (in small amounts, with no pulp or skins, and with fruits from allowed list). Sports drinks. Herbal tea. °Fats and oils °Olive oil, canola oil, sunflower oil, flaxseed oil, and grapeseed oil. Mayonnaise. Cream cheese. Margarine. Butter. °Sweets and desserts °Plain cakes and cookies. Cream pies and pies made with allowed fruits. Pudding. Custard. Fruit gelatin. Sherbet. Popsicles. Ice cream without nuts. Plain hard candy. Honey. Jelly. Molasses. Syrups, including chocolate syrup. Chocolate. Marshmallows. Gumdrops. °Seasoning and other foods °Bouillon. Broth. Cream soups made from allowed foods.  Strained soup. Casseroles made with allowed foods. Ketchup. Mild mustard. Mild salad dressings. Plain gravies. Vinegar. Spices in moderation. Salt. Sugar. °What foods are not allowed? °The items listed below may not be a complete list. Talk with your dietitian about what dietary choices are best for you. °Grains °Whole wheat and whole grain breads and crackers. Multigrain breads and crackers. Rye bread. Whole grain or multigrain cereals. Cereals with nuts, raisins, or coconut. Bran. Coarse wheat cereals. Granola. High-fiber cereals. Cornmeal or corn bread. Whole grain pasta. Wild or brown rice. Quinoa. Popcorn. Buckwheat. Wheat germ. °Vegetables °Potato skins. Raw or undercooked vegetables. All beans and bean sprouts. Cooked greens. Corn. Peas. Cabbage. Beets. Broccoli. Brussels sprouts. Cauliflower. Mushrooms. Onions. Peppers. Parsnips. Okra. Sauerkraut. °Fruit °Raw or dried fruit. Berries. Fruit juice with pulp. Prune juice. °Meats and other protein foods °Tough, fibrous meats with gristle. Fatty meat. Poultry with skin. Fried meat, poultry, or fish. Deli or lunch meats. Sausage, bacon, and hot dogs. Nuts and chunky nut butter. Dried peas, beans, and lentils. °Dairy °Yogurt with fruit, nuts, chocolate, or granola mix-ins. °Beverages °Caffeinated coffee and teas. °Fats and oils °Avocado. Coconut. °Sweets and desserts °Desserts, cookies, or candies that contain nuts or coconut. Dried fruit. Jams and preserves with seeds. Marmalade. Any dessert made with fruits or grains that are not allowed. °Seasoning and other   foods °Corn tortilla chips. Soups made with vegetables or grains that are not allowed. Relish. Horseradish. Pickles. Olives. °Summary °· Most people on a low-fiber eating plan should eat less than 10 grams of fiber a day. Follow recommendations from your dietitian about how much fiber you should have each day. °· Always check food labels to see the dietary fiber content of packaged foods. In general, a  low-fiber food will have fewer than 2 grams of fiber per serving. °· In general, try to avoid whole grains, raw fruits and vegetables, dried fruit, tough cuts of meat, nuts, and seeds. °· Take a vitamin and mineral supplement as told by your health care provider or dietitian. °This information is not intended to replace advice given to you by your health care provider. Make sure you discuss any questions you have with your health care provider. °Document Released: 03/19/2002 Document Revised: 01/19/2019 Document Reviewed: 11/30/2016 °Elsevier Patient Education © 2020 Elsevier Inc. ° °

## 2019-06-28 NOTE — Progress Notes (Signed)
[facility]/Trauma Progress Note      Assessment/Plan History of diverticulitis  Perforated sigmoid diverticulitis -Continue bowel rest, IV antibiotics, IV fluids -Patient is nontoxic-appearing, no peritonitis on exam, -Ideally we will try to avoid emergent surgery but if patient worsens he will need Hartman's procedure -Repeat CT scan  today pending  FEN:N.p.o. VTE: SCD's, lovenox TI:[id number] and Flagyl09/13>>,afebrile, WBC5.0 Foley:None Follow up:TBD   LOS: 4 days    Subjective: CC: Abdominal pain  Pain is again improved today.  No issues overnight.  Patient denies nausea, vomiting, fever or chills.  He is drinking his prep for his CT scan.  Objective: Vital signs in last 24 hours: Temp:  [98.4 F (36.9 C)-98.9 F (37.2 C)] 98.8 F (37.1 C) (09/17 0543) Pulse Rate:  [58-64] 58 (09/17 0543) Resp:  [18] 18 (09/17 0543) BP: (116-137)/(84-87) 137/84 (09/17 0543) SpO2:  [97 %-99 %] 97 % (09/17 0543) Last BM Date: 06/28/19  Intake/Output from previous day: 09/16 0701 - 09/17 0700 In: 3826.5 [P.O.:1310; I.V.:1974.5; IV Piggyback:542] Out: 1200 [Urine:1200] Intake/Output this shift: No intake/output data recorded.  PE: Gen: Alert, NAD, pleasant, cooperative,nontoxic-appearing Card: RRR, no M/G/R heard Pulm: CTA, no W/R/R, rate andeffort normal Abd: Soft, ND, +BS, no HSM,TTP of lower abdomen without guarding. No peritonitis.no hernias noted Skin: no rashes noted, warm and dry   Anti-infectives: Anti-infectives (From admission, onward)   Start     Dose/Rate Route Frequency Ordered Stop   06/24/19 0600  ceFEPIme (MAXIPIME) 2 g in sodium chloride 0.9 % 100 mL IVPB     2 g 200 mL/hr over 30 Minutes Intravenous Every 8 hours 06/24/19 0110     06/24/19 0600  metroNIDAZOLE (FLAGYL) IVPB 500 mg     500 mg 100 mL/hr over 60 Minutes Intravenous Every 8 hours 06/24/19 0110     06/23/19 2330  ceFEPIme (MAXIPIME) 2 g in sodium chloride  0.9 % 100 mL IVPB     2 g 200 mL/hr over 30 Minutes Intravenous  Once 06/23/19 2317 06/24/19 0005   06/23/19 2330  metroNIDAZOLE (FLAGYL) IVPB 500 mg     500 mg 100 mL/hr over 60 Minutes Intravenous  Once 06/23/19 2317 06/24/19 0106      Lab Results:  Recent Labs    06/27/19 0253 06/28/19 0309  WBC 5.6 5.0  HGB 13.9 13.6  HCT 43.3 42.2  PLT 221 216   BMET No results for input(s): NA, K, CL, CO2, GLUCOSE, BUN, CREATININE, CALCIUM in the last 72 hours. PT/INR No results for input(s): LABPROT, INR in the last 72 hours. CMP     Component Value Date/Time   NA 139 06/24/2019 0407   K 3.7 06/24/2019 0407   CL 105 06/24/2019 0407   CO2 25 06/24/2019 0407   GLUCOSE 128 (H) 06/24/2019 0407   BUN 11 06/24/2019 0407   CREATININE 1.20 06/24/2019 0407   CALCIUM 8.8 (L) 06/24/2019 0407   PROT 8.4 (H) 06/23/2019 2051   ALBUMIN 4.6 06/23/2019 2051   AST 18 06/23/2019 2051   ALT 31 06/23/2019 2051   ALKPHOS 64 06/23/2019 2051   BILITOT 1.5 (H) 06/23/2019 2051   GFRNONAA >60 06/24/2019 0407   GFRAA >60 06/24/2019 0407   Lipase     Component Value Date/Time   LIPASE 22 06/23/2019 2051    Studies/Results: No results found.    Kalman Drape , [facility] Surgery 06/28/2019, 10:38 AM  Pager: [phone] Mon-Wed, Friday 7:00am-4:30pm Thurs 7am-11:30am  Consults: [phone]

## 2019-06-29 LAB — CBC
HCT: 43.1 % (ref 39.0–52.0)
Hemoglobin: 14 g/dL (ref 13.0–17.0)
MCH: 30.7 pg (ref 26.0–34.0)
MCHC: 32.5 g/dL (ref 30.0–36.0)
MCV: 94.5 fL (ref 80.0–100.0)
Platelets: 215 10*3/uL (ref 150–400)
RBC: 4.56 MIL/uL (ref 4.22–5.81)
RDW: 12.4 % (ref 11.5–15.5)
WBC: 6.5 10*3/uL (ref 4.0–10.5)
nRBC: 0 % (ref 0.0–0.2)

## 2019-06-29 MED ORDER — METRONIDAZOLE 500 MG PO TABS: 500.0000 mg | ORAL_TABLET | Freq: Three times a day (TID) | ORAL | 0 refills | Status: AC

## 2019-06-29 MED ORDER — AMOXICILLIN-POT CLAVULANATE 875-125 MG PO TABS: 1.0000 | ORAL_TABLET | Freq: Two times a day (BID) | ORAL | 0 refills | Status: DC

## 2019-06-29 MED ORDER — OXYCODONE HCL 5 MG PO TABS: 5.0000 mg | ORAL_TABLET | Freq: Four times a day (QID) | ORAL | 0 refills | Status: DC | PRN

## 2019-06-29 MED ORDER — CEFDINIR 300 MG PO CAPS: 300.0000 mg | ORAL_CAPSULE | Freq: Two times a day (BID) | ORAL | 0 refills | Status: AC

## 2019-06-29 MED ORDER — AMOXICILLIN-POT CLAVULANATE 875-125 MG PO TABS: 1.0000 | ORAL_TABLET | Freq: Two times a day (BID) | ORAL | Status: DC

## 2019-06-29 NOTE — Progress Notes (Signed)
Pt alert, oriented, tolerating diet. D/C instructions given, pt dc/d home. 

## 2019-06-29 NOTE — TOC Transition Note (Signed)
Transition of Care [facility]) - CM/SW Discharge Note   Patient Details  Name: Matthew Mack MRN: [id number] Date of Birth: Nov 28, 1965  [facility] of [facility] CM/SW Contact:  Leeroy Cha, RN Phone Number: 06/29/2019, 10:19 AM   Clinical Narrative:    Orders checked for dc needs none found. dcd to home with self care.   Final next level of care: Home/Self Care Barriers to Discharge: No Barriers Identified   Patient Goals and CMS Choice        Discharge Placement                       Discharge Plan and Services   Discharge Planning Services: CM Consult                                 Social Determinants of Health (SDOH) Interventions     Readmission Risk Interventions No flowsheet data found.

## 2019-06-29 NOTE — Plan of Care (Signed)
All goals met for d/c.

## 2019-06-29 NOTE — Discharge Summary (Signed)
[facility]/Trauma Discharge Summary   Patient ID: Matthew Mack MRN: [id number] DOB/AGE: [birth year] 53 y.o.  Admit date: 06/23/2019 Discharge date: 06/29/2019  Admitting Diagnosis: Perforated sigmoid diverticulitis Pneumoperitoneum  Discharge Diagnosis Patient Active Problem List   Diagnosis Date Noted  . Perforated diverticulum of large intestine 06/24/2019    Consultants None  Imaging: Ct Abdomen Pelvis W Contrast  Result Date: 06/28/2019 CLINICAL DATA:  Diverticulitis EXAM: CT ABDOMEN AND PELVIS WITH CONTRAST TECHNIQUE: Multidetector CT imaging of the abdomen and pelvis was performed using the standard protocol following bolus administration of intravenous contrast. Oral contrast was also administered. CONTRAST:  19mL OMNIPAQUE IOHEXOL 300 MG/ML SOLN, 180mL OMNIPAQUE IOHEXOL 300 MG/ML SOLN COMPARISON:  June 23, 2019 FINDINGS: Lower chest: There is atelectatic change in the posterior right lung base. Lung base regions otherwise clear. Hepatobiliary: There is a degree of hepatic steatosis. No focal liver lesions are evident. The gallbladder wall is not appreciably thickened. There is no biliary duct dilatation. Note that gas in the region of the falciform ligament is no longer evident compared to recent CT. Pancreas: There is no pancreatic mass or inflammatory focus. Spleen: No splenic lesions are evident. Adrenals/Urinary Tract: Unremarkable. Kidneys bilaterally show no evident mass or hydronephrosis on either side. There is no evident renal or ureteral calculus on either side. Urinary bladder is midline with wall thickness within normal limits. Stomach/Bowel: There remains wall thickening throughout much of the sigmoid colon. There are multiple irregular diverticula in this area. Localized epiploic appendagitis in the mid leftward aspect of the sigmoid colon is again noted. Scattered areas of fluid remain in this area of diverticulitis. There is slightly less extraluminal  air in the region of the diverticulitis. Note that there are foci of pneumoperitoneum in the upper abdomen adjacent to the liver and inferior to the right hemidiaphragm. There has been no progression of pneumoperitoneum since the previous study. There remains inflammatory change from the diverticulitis which is abutting and thickening walls of proximal ileum in the mid pelvic region. No new inflammatory changes are evident compared to the previous study. No bowel obstruction evident. Terminal ileum appears unremarkable. No portal venous air. Vascular/Lymphatic: No abdominal aortic aneurysm. There is slight arterial vascular calcification at the origin of the right common iliac artery. No adenopathy is evident in the abdomen or pelvis by size criteria. There are subcentimeter mesenteric lymph nodes which may well have reactive etiology given the extensive inflammation from recent diverticulitis. Reproductive: Prostate and seminal vesicles are normal in size and contour. Other: Appendix appears unremarkable. No well-defined abscess or ascites evident. Note that there are areas of mildly loculated fluid in the region of the sigmoid diverticulitis. There are postoperative changes in the lower left pelvis with apparent mesh. Musculoskeletal: No blastic or lytic bone lesions. No intramuscular lesions are evident. There is a small amount air and nonspecific soft tissue thickening in the lateral right abdomen near the superior aspect of the iliac on the right, a questionable injection site with mild hematoma. A smaller second focus is seen more anteriorly in the right lower quadrant. IMPRESSION: 1. Pneumoperitoneum remains from recent perforated diverticulitis. There has been no progression of pneumoperitoneum since the previous study. There may be marginally less free air in the region of the diverticulitis itself. 2. The degree of inflammation throughout the sigmoid colon appear similar compared to previous study. There  are irregular diverticula as well as epiploic appendagitis in this area. Inflammation extends toward the right from the diverticulitis to cause thickening of  several loops of ileum in the upper to mid pelvic regions. There is mesenteric thickening to the right of midline as well consistent with peritonitis which is stable. There has been no progression of inflammation or fluid since the recent prior study. 3. No well-defined abscess in the abdomen or pelvis. Appendix region appears normal. No bowel obstruction. 4. Postoperative change in the left and midline lower pelvis. Mesh present in these areas. 5. Mild air in the right lateral lower abdomen region, likely injection sites. 6.  Hepatic steatosis. Electronically Signed   By: Bretta BangWilliam  Woodruff III M.D.   On: 06/28/2019 12:23    Procedures None  HPI: The patient is a 53 year old white male who has a history of diverticulitis who developed left lower quadrant abdominal pain about 3 or 4 days ago.  The pain persisted so he went to an urgent care yesterday and got antibiotics.  The pain was worse today so he came to the emergency department where a CT scan does show sigmoid diverticulitis with several dots of free air but not a lot of free fluid.  His white count is 11,000 he has no fever or tachycardia or hypotension.  He is otherwise in good health.  Hospital Course:  Workup showed perforated sigmoid diverticulitis with pneumoperitoneum.  Patient was admitted to the hospital and placed on IV antibiotics and bowel rest. Pain improved.  WBC normalized.  Repeat CT scan on 9/17 showed no progression of pneumoperitoneum, inflammation throughout sigmoid colon appears similar, no well-defined abscess noted.  Diet was advanced as tolerated.  Patient was tolerating a diet without increase in pain.  Patient remained afebrile.  On 9/18, the patient was voiding well, tolerating diet, ambulating well, pain well controlled, vital signs stable, and felt stable for  discharge home.  Patient will follow up as outlined below and knows to call with questions or concerns.     Patient was discharged in good condition.  The [place] Substance controlled database was reviewed prior to prescribing narcotic pain medication to this patient.  Physical Exam: Gen: Alert, NAD, pleasant, cooperative,nontoxic-appearing Card: RRR, no M/G/R heard Pulm: CTA, no W/R/R, rate andeffort normal Abd: Soft, ND, +BS, no HSM,very mild TTP of lower abdomen without guarding. No peritonitis.no hernias noted Skin: no rashes noted, warm and dry  Allergies as of 06/29/2019      Reactions   Penicillins Hives   Sulfa Antibiotics Itching      Medication List    STOP taking these medications   ciprofloxacin 500 MG tablet Commonly known as: CIPRO     TAKE these medications   acetaminophen 325 MG tablet Commonly known as: TYLENOL Take 650 mg by mouth every 6 (six) hours as needed for moderate pain or headache.   cefdinir 300 MG capsule Commonly known as: OMNICEF Take 1 capsule (300 mg total) by mouth 2 (two) times daily for 14 days.   metroNIDAZOLE 500 MG tablet Commonly known as: Flagyl Take 1 tablet (500 mg total) by mouth 3 (three) times daily for 14 days.   oxyCODONE 5 MG immediate release tablet Commonly known as: Oxy IR/ROXICODONE Take 1 tablet (5 mg total) by mouth every 6 (six) hours as needed for moderate pain.        Follow-up Information    Griselda Mineroth, Paul III, MD. Go on 07/19/2019.   Specialty: General Surgery Why: Your appointment is 07/19/19 at 2:50pm. Arrive 30 minutes prior to your appointment to check in and fill out paperwork. Bring photo ID and insurance  information. Contact information: [address] [place] [address] [phone]           Signed: Joyce Copa [facility] Surgery 06/29/2019, 9:57 AM Pager: [phone] Consults: [phone] Mon-Fri 7:00 am-4:30 pm Sat-Sun 7:00 am-11:30 am

## 2019-07-19 DIAGNOSIS — K5732 Diverticulitis of large intestine without perforation or abscess without bleeding: Secondary | ICD-10-CM | POA: Diagnosis not present

## 2019-07-26 DIAGNOSIS — K5792 Diverticulitis of intestine, part unspecified, without perforation or abscess without bleeding: Secondary | ICD-10-CM | POA: Diagnosis not present

## 2019-08-24 DIAGNOSIS — Z1159 Encounter for screening for other viral diseases: Secondary | ICD-10-CM | POA: Diagnosis not present

## 2019-08-29 DIAGNOSIS — R933 Abnormal findings on diagnostic imaging of other parts of digestive tract: Secondary | ICD-10-CM | POA: Diagnosis not present

## 2019-08-29 DIAGNOSIS — K5732 Diverticulitis of large intestine without perforation or abscess without bleeding: Secondary | ICD-10-CM | POA: Diagnosis not present

## 2019-11-28 ENCOUNTER — Ambulatory Visit: Payer: Self-pay | Admitting: General Surgery

## 2019-11-28 MED ORDER — DEXTROSE 5 % IV SOLN
900.0000 mg | INTRAVENOUS | Status: AC
  Administered 2020-01-17: 900 mg via INTRAVENOUS

## 2019-11-28 MED ORDER — GENTAMICIN SULFATE 40 MG/ML IJ SOLN: 5.0000 mg/kg | INTRAVENOUS | Status: DC

## 2020-01-08 NOTE — Progress Notes (Addendum)
Matthew Mack  Your procedure is scheduled on Thursday April 8.  Report to [facility] Entr[facility] "A" at 12:00 P.M., and check in at the Admitting office.  Call this number if you have problems the morning of surgery: [phone]  Call [phone] if you have any questions prior to your surgery date Monday-Friday 8am-4pm   Remember: Do not eat after midnight the night before your surgery  You may drink clear liquids until 11:00 A.M the morning of your surgery.   Clear liquids allowed are: Water, Non-Citrus Juices (without pulp), Carbonated Beverages, Clear Tea, Black Coffee Only, and Gatorade   No medications necessary for morning of surgery.  Please complete your PRE-SURGERY ENSURE that was provided to you by 11:00 A.M. the morning of surgery.  Please, if able, drink it in one setting. DO NOT SIP. >>Night before 2 ENSURE >>Morning of surgery 1 ENSURE  7 days prior to surgery STOP taking any Aspirin (unless otherwise instructed by your surgeon), Aleve, Naproxen, Ibuprofen, Motrin, Advil, Goody's, BC's, all herbal medications, fish oil, and all vitamins.    The Morning of Surgery  Do not wear jewelry  Do not wear lotions, powders, colognes, or deodorant Men may shave face and neck.  Do not bring valuables to the hospital.  [facility] is not responsible for any belongings or valuables.  If you are a smoker, DO NOT Smoke 24 hours prior to surgery  If you wear a CPAP at night please bring your mask the morning of surgery   Remember that you must have someone to transport you home after your surgery, and remain with you for 24 hours if you are discharged the same day.   Please bring cases for contacts, glasses, hearing aids, dentures or bridgework because it cannot be worn into surgery.    Leave your suitcase in the car.  After surgery it may be brought to your room.  For patients admitted to the hospital, discharge time will be determined by your treatment  team.  Patients discharged the day of surgery will not be allowed to drive home.    Special instructions:   [facility]- Preparing For Surgery  Before surgery, you can play an important role. Because skin is not sterile, your skin needs to be as free of germs as possible. You can reduce the number of germs on your skin by washing with CHG (chlorahexidine gluconate) Soap before surgery.  CHG is an antiseptic cleaner which kills germs and bonds with the skin to continue killing germs even after washing.    Oral Hygiene is also important to reduce your risk of infection.  Remember - BRUSH YOUR TEETH THE MORNING OF SURGERY WITH YOUR REGULAR TOOTHPASTE  Please do not use if you have an allergy to CHG or antibacterial soaps. If your skin becomes reddened/irritated stop using the CHG.  Do not shave (including legs and underarms) for at least 48 hours prior to first CHG shower. It is OK to shave your face.  Please follow these instructions carefully.   1. Shower the NIGHT BEFORE SURGERY and the MORNING OF SURGERY with CHG Soap.   2. If you chose to wash your hair and body, wash as usual with your normal shampoo and body-wash/soap.  3. Rinse your hair and body thoroughly to remove the shampoo and soap.  4. Apply CHG directly to the skin (ONLY FROM THE NECK DOWN) and wash gently with a scrungie or a clean washcloth.   5. Do not use on open  wounds or open sores. Avoid contact with your eyes, ears, mouth and genitals (private parts). Wash Face and genitals (private parts)  with your normal soap.   6. Wash thoroughly, paying special attention to the area where your surgery will be performed.  7. Thoroughly rinse your body with warm water from the neck down.  8. DO NOT shower/wash with your normal soap after using and rinsing off the CHG Soap.  9. Pat yourself dry with a CLEAN TOWEL.  10. Wear CLEAN PAJAMAS to bed the night before surgery  11. Place CLEAN SHEETS on your bed the night of your  first shower and DO NOT SLEEP WITH PETS.  12. Wear comfortable clothes the morning of surgery.     Day of Surgery:  Please shower the morning of surgery with the CHG soap Do not apply any deodorants/lotions. Please wear clean clothes to the hospital/surgery center.   Remember to brush your teeth WITH YOUR REGULAR TOOTHPASTE.   Please read over the following fact sheets that you were given.

## 2020-01-09 ENCOUNTER — Encounter (HOSPITAL_COMMUNITY): Payer: Self-pay

## 2020-01-09 ENCOUNTER — Other Ambulatory Visit: Payer: Self-pay

## 2020-01-09 ENCOUNTER — Encounter (HOSPITAL_COMMUNITY)
Admission: RE | Admit: 2020-01-09 | Discharge: 2020-01-09 | Disposition: A | Discharge status: Home / Self Care | Payer: BC Managed Care – PPO | Source: Ambulatory Visit | Attending: General Surgery | Admitting: General Surgery

## 2020-01-09 DIAGNOSIS — Z01812 Encounter for preprocedural laboratory examination: Secondary | ICD-10-CM | POA: Insufficient documentation

## 2020-01-09 DIAGNOSIS — Z79899 Other long term (current) drug therapy: Secondary | ICD-10-CM | POA: Insufficient documentation

## 2020-01-09 DIAGNOSIS — K5732 Diverticulitis of large intestine without perforation or abscess without bleeding: Secondary | ICD-10-CM | POA: Insufficient documentation

## 2020-01-09 HISTORY — DX: Hyperlipidemia, unspecified: E78.5

## 2020-01-09 LAB — CBC
HCT: 48.6 % (ref 39.0–52.0)
Hemoglobin: 16.5 g/dL (ref 13.0–17.0)
MCH: 31.2 pg (ref 26.0–34.0)
MCHC: 34 g/dL (ref 30.0–36.0)
MCV: 91.9 fL (ref 80.0–100.0)
Platelets: 203 10*3/uL (ref 150–400)
RBC: 5.29 MIL/uL (ref 4.22–5.81)
RDW: 12.1 % (ref 11.5–15.5)
WBC: 4 10*3/uL (ref 4.0–10.5)
nRBC: 0 % (ref 0.0–0.2)

## 2020-01-09 LAB — HEMOGLOBIN A1C
Hgb A1c MFr Bld: 5.3 % (ref 4.8–5.6)
Mean Plasma Glucose: 105.41 mg/dL

## 2020-01-09 LAB — TYPE AND SCREEN
ABO/RH(D): O POS
Antibody Screen: NEGATIVE

## 2020-01-09 LAB — ABO/RH: ABO/RH(D): O POS

## 2020-01-09 NOTE — Progress Notes (Signed)
PCP - Aliene Beams, MD Cardiologist - pt denies  Chest x-ray - n/a EKG - n/a Stress Test - pt denies ECHO - pt denies Cardiac Cath - pt denies   ERAS Protcol - yes PRE-SURGERY Ensure or G2- pt instructed to finish 2 ensure the night before surgery before midnight and to complete 1 ensure morning of surgery before 11:00 A.M  COVID TEST- scheduled 01/14/20   Coronavirus Screening  Have you experienced the following symptoms:  Cough yes/no: No Fever (>100.63F)  yes/no: No Runny nose yes/no: No Sore throat yes/no: No Difficulty breathing/shortness of breath  yes/no: No  Have you or a family member traveled in the last 14 days and where? yes/no: No   If the patient indicates "YES" to the above questions, their PAT will be rescheduled to limit the exposure to others and, the surgeon will be notified. THE PATIENT WILL NEED TO BE ASYMPTOMATIC FOR 14 DAYS.   If the patient is not experiencing any of these symptoms, the PAT nurse will instruct them to NOT bring anyone with them to their appointment since they may have these symptoms or traveled as well.   Please remind your patients and families that hospital visitation restrictions are in effect and the importance of the restrictions.     Anesthesia review: yes - documents requested  Patient denies shortness of breath, fever, cough and chest pain at PAT appointment   All instructions explained to the patient, with a verbal understanding of the material. Patient agrees to go over the instructions while at home for a better understanding. Patient also instructed to self quarantine after being tested for COVID-19. The opportunity to ask questions was provided.

## 2020-01-14 ENCOUNTER — Other Ambulatory Visit (HOSPITAL_COMMUNITY)
Admission: RE | Admit: 2020-01-14 | Discharge: 2020-01-14 | Disposition: A | Discharge status: Home / Self Care | Payer: BC Managed Care – PPO | Source: Ambulatory Visit | Attending: General Surgery | Admitting: General Surgery

## 2020-01-14 ENCOUNTER — Encounter (HOSPITAL_COMMUNITY): Payer: Self-pay

## 2020-01-14 DIAGNOSIS — Z20822 Contact with and (suspected) exposure to covid-19: Secondary | ICD-10-CM | POA: Diagnosis not present

## 2020-01-14 DIAGNOSIS — Z01812 Encounter for preprocedural laboratory examination: Secondary | ICD-10-CM | POA: Diagnosis not present

## 2020-01-14 LAB — SARS CORONAVIRUS 2 (TAT 6-24 HRS): SARS Coronavirus 2: NEGATIVE

## 2020-01-14 NOTE — Progress Notes (Signed)
Anesthesia Chart Review:  Case: [id number] Date/Time: 01/17/20 1345   Procedure: LAPAROSCOPIC ASSISTED SIGMOID COLECTOMY (N/A )   Anesthesia type: General   Pre-op diagnosis: SIGMOID DIVERTICULITIS   Location: [facility] ROOM 02 / [facility] OR   Surgeons: Griselda Miner, MD      DISCUSSION: Patient is a 54 year old male scheduled for the above procedure.   History includes never smoker, diverticulitis (hospitalization for perforated sigmoid diverticulitis 06/2019, treated with non-operative management), HLD, hernia repair. BMI is consistent with obesity.   He denied chest pain, SOB, cough, fever at PAT RN visit. Chart forwarded for additional review pending records from PCP which showed that last evaluation with Dr. Tracie Harrier was on 04/10/19 for annual exam. He was started on medication for hyperlipidemia (Lipitor is mentioned), although no cholesterol medications are currently on his medication list. Last labs there are from 2019. On 12/30/17 A1c 5.5% and Creatinine high normal at 1.26, AST 28, ALT 46, and TSH 2.50--He had more recent metabolic panel at [facility] in 06/2019 with Creatinine 1.10-1.20 and AST 18, ALT 31. Last EKG at [facility] was on 03/14/14 and showed SB at 59 bpm.   Presurgical COVID-19 test is scheduled for 01/14/20. Anesthesia team to evaluate on the day of surgery.    VS: BP 127/90   Pulse 60   Temp 37 C (Oral)   Resp 17   Ht 5\' 10"  (1.778 m)   Wt 99.2 kg   SpO2 99%   BMI 31.38 kg/m   BP Readings from Last 3 Encounters:  01/09/20 127/90  06/29/19 125/80  06/22/19 (!) 143/92     PROVIDERS: 08/22/19, MD is PCP    LABS: Labs reviewed: Acceptable for surgery. (all labs ordered are listed, but only abnormal results are displayed)  Labs Reviewed  HEMOGLOBIN A1C  CBC  TYPE AND SCREEN  ABO/RH    EKG: N/A (Last EKG at [facility] on 03/14/14 showed SB at 59 bpm)   CV: N/A   Past Medical History:  Diagnosis Date  . Diverticulitis   . Hyperlipidemia     Past  Surgical History:  Procedure Laterality Date  . HERNIA REPAIR      MEDICATIONS: . ibuprofen (ADVIL) 200 MG tablet  . oxyCODONE (OXY IR/ROXICODONE) 5 MG immediate release tablet   No current facility-administered medications for this encounter.   . clindamycin (CLEOCIN) 900 mg in dextrose 5 % 50 mL IVPB   And  . gentamicin (GARAMYCIN) 5 mg/kg in dextrose 5 % 50 mL IVPB    05/14/14, PA-C Surgical Short Stay/Anesthesiology [facility] Phone [phone] [facility] Phone [phone] 01/14/2020 9:41 AM

## 2020-01-14 NOTE — Anesthesia Preprocedure Evaluation (Addendum)
Anesthesia Evaluation  Patient identified by MRN, date of birth, ID band Patient awake    Reviewed: Allergy & Precautions, NPO status , Patient's Chart, lab work & pertinent test results  History of Anesthesia Complications Negative for: history of anesthetic complications  Airway Mallampati: II  TM Distance: >3 FB Neck ROM: Full    Dental no notable dental hx.    Pulmonary neg pulmonary ROS,    Pulmonary exam normal        Cardiovascular negative cardio ROS Normal cardiovascular exam     Neuro/Psych negative neurological ROS  negative psych ROS   GI/Hepatic Neg liver ROS, diverticulitis   Endo/Other  negative endocrine ROS  Renal/GU negative Renal ROS  negative genitourinary   Musculoskeletal negative musculoskeletal ROS (+)   Abdominal   Peds  Hematology negative hematology ROS (+)   Anesthesia Other Findings Day of surgery medications reviewed with patient.  Reproductive/Obstetrics negative OB ROS                            Anesthesia Physical Anesthesia Plan  ASA: II  Anesthesia Plan: General   Post-op Pain Management:    Induction: Intravenous  PONV Risk Score and Plan: 3 and Treatment may vary due to age or medical condition, Ondansetron, Dexamethasone and Midazolam  Airway Management Planned: Oral ETT  Additional Equipment: None  Intra-op Plan:   Post-operative Plan: Extubation in OR  Informed Consent: I have reviewed the patients History and Physical, chart, labs and discussed the procedure including the risks, benefits and alternatives for the proposed anesthesia with the patient or authorized representative who has indicated his/her understanding and acceptance.     Dental advisory given  Plan Discussed with: CRNA  Anesthesia Plan Comments: (PAT note written 01/14/2020 by Shonna Chock, PA-C. )      Anesthesia Quick Evaluation

## 2020-01-17 ENCOUNTER — Other Ambulatory Visit: Payer: Self-pay

## 2020-01-17 ENCOUNTER — Encounter (HOSPITAL_COMMUNITY)
Admission: RE | Disposition: A | Discharge status: Home / Self Care | Payer: Self-pay | Source: Home / Self Care | Attending: General Surgery

## 2020-01-17 ENCOUNTER — Inpatient Hospital Stay (HOSPITAL_COMMUNITY): Payer: BC Managed Care – PPO | Admitting: Vascular Surgery

## 2020-01-17 ENCOUNTER — Inpatient Hospital Stay (HOSPITAL_COMMUNITY): Payer: BC Managed Care – PPO | Admitting: Anesthesiology

## 2020-01-17 ENCOUNTER — Inpatient Hospital Stay (HOSPITAL_COMMUNITY)
Admission: RE | Admit: 2020-01-17 | Discharge: 2020-01-20 | DRG: 331 | Disposition: A | Discharge status: Home / Self Care | Payer: BC Managed Care – PPO | Attending: General Surgery | Admitting: General Surgery

## 2020-01-17 ENCOUNTER — Encounter (HOSPITAL_COMMUNITY): Payer: Self-pay | Admitting: General Surgery

## 2020-01-17 DIAGNOSIS — Z880 Allergy status to penicillin: Secondary | ICD-10-CM

## 2020-01-17 DIAGNOSIS — E785 Hyperlipidemia, unspecified: Secondary | ICD-10-CM | POA: Diagnosis not present

## 2020-01-17 DIAGNOSIS — K5732 Diverticulitis of large intestine without perforation or abscess without bleeding: Secondary | ICD-10-CM | POA: Diagnosis not present

## 2020-01-17 DIAGNOSIS — Z882 Allergy status to sulfonamides status: Secondary | ICD-10-CM | POA: Diagnosis not present

## 2020-01-17 DIAGNOSIS — K5720 Diverticulitis of large intestine with perforation and abscess without bleeding: Principal | ICD-10-CM | POA: Diagnosis present

## 2020-01-17 HISTORY — PX: LAPAROSCOPIC SIGMOID COLECTOMY: SHX5928

## 2020-01-17 SURGERY — COLECTOMY, SIGMOID, LAPAROSCOPIC
Anesthesia: General

## 2020-01-17 MED ORDER — 0.9 % SODIUM CHLORIDE (POUR BTL) OPTIME
TOPICAL | Status: DC | PRN
  Administered 2020-01-17: 2000 mL

## 2020-01-17 MED ORDER — LACTATED RINGERS IV SOLN: INTRAVENOUS | Status: DC

## 2020-01-17 MED ORDER — HYDROMORPHONE HCL 1 MG/ML IJ SOLN
0.2500 mg | INTRAMUSCULAR | Status: DC | PRN
  Administered 2020-01-17 (×4): 0.5 mg via INTRAVENOUS

## 2020-01-17 MED ORDER — NEOMYCIN SULFATE 500 MG PO TABS
1000.0000 mg | ORAL_TABLET | ORAL | Status: DC
  Filled 2020-01-17: qty 2

## 2020-01-17 MED ORDER — BUPIVACAINE-EPINEPHRINE 0.25% -1:200000 IJ SOLN
INTRAMUSCULAR | Status: DC | PRN
  Administered 2020-01-17: 13 mL

## 2020-01-17 MED ORDER — ONDANSETRON HCL 4 MG/2ML IJ SOLN: 4.0000 mg | Freq: Four times a day (QID) | INTRAMUSCULAR | Status: DC | PRN

## 2020-01-17 MED ORDER — FENTANYL CITRATE (PF) 250 MCG/5ML IJ SOLN
INTRAMUSCULAR | Status: DC | PRN
  Administered 2020-01-17: 100 ug via INTRAVENOUS
  Administered 2020-01-17: 50 ug via INTRAVENOUS
  Administered 2020-01-17: 100 ug via INTRAVENOUS

## 2020-01-17 MED ORDER — PROPOFOL 10 MG/ML IV BOLUS
INTRAVENOUS | Status: AC
  Filled 2020-01-17: qty 20

## 2020-01-17 MED ORDER — HEPARIN SODIUM (PORCINE) 5000 UNIT/ML IJ SOLN
5000.0000 [IU] | Freq: Three times a day (TID) | INTRAMUSCULAR | Status: DC
  Administered 2020-01-18 – 2020-01-20 (×7): 5000 [IU] via SUBCUTANEOUS
  Filled 2020-01-17 (×7): qty 1

## 2020-01-17 MED ORDER — MIDAZOLAM HCL 2 MG/2ML IJ SOLN
INTRAMUSCULAR | Status: AC
  Filled 2020-01-17: qty 2

## 2020-01-17 MED ORDER — ROCURONIUM BROMIDE 10 MG/ML (PF) SYRINGE
PREFILLED_SYRINGE | INTRAVENOUS | Status: DC | PRN
  Administered 2020-01-17: 60 mg via INTRAVENOUS
  Administered 2020-01-17 (×2): 20 mg via INTRAVENOUS

## 2020-01-17 MED ORDER — ONDANSETRON HCL 4 MG/2ML IJ SOLN
INTRAMUSCULAR | Status: AC
  Filled 2020-01-17: qty 2

## 2020-01-17 MED ORDER — FENTANYL CITRATE (PF) 250 MCG/5ML IJ SOLN
INTRAMUSCULAR | Status: AC
  Filled 2020-01-17: qty 5

## 2020-01-17 MED ORDER — HYDROMORPHONE HCL 1 MG/ML IJ SOLN
INTRAMUSCULAR | Status: AC
  Filled 2020-01-17: qty 1

## 2020-01-17 MED ORDER — GABAPENTIN 300 MG PO CAPS
ORAL_CAPSULE | ORAL | Status: AC
  Administered 2020-01-17: 300 mg via ORAL
  Filled 2020-01-17: qty 1

## 2020-01-17 MED ORDER — ACETAMINOPHEN 500 MG PO TABS: 1000.0000 mg | ORAL_TABLET | ORAL | Status: AC

## 2020-01-17 MED ORDER — ACETAMINOPHEN 500 MG PO TABS: 1000.0000 mg | ORAL_TABLET | Freq: Once | ORAL | Status: AC

## 2020-01-17 MED ORDER — GABAPENTIN 300 MG PO CAPS: 300.0000 mg | ORAL_CAPSULE | ORAL | Status: AC

## 2020-01-17 MED ORDER — OXYCODONE HCL 5 MG/5ML PO SOLN: 5.0000 mg | Freq: Once | ORAL | Status: DC | PRN

## 2020-01-17 MED ORDER — ALVIMOPAN 12 MG PO CAPS
ORAL_CAPSULE | ORAL | Status: AC
  Administered 2020-01-17: 12 mg via ORAL
  Filled 2020-01-17: qty 1

## 2020-01-17 MED ORDER — ENSURE SURGERY PO LIQD
237.0000 mL | Freq: Two times a day (BID) | ORAL | Status: DC
  Administered 2020-01-18 (×2): 237 mL via ORAL
  Filled 2020-01-17 (×6): qty 237

## 2020-01-17 MED ORDER — ALVIMOPAN 12 MG PO CAPS: 12.0000 mg | ORAL_CAPSULE | ORAL | Status: AC

## 2020-01-17 MED ORDER — PROMETHAZINE HCL 25 MG/ML IJ SOLN: 6.2500 mg | INTRAMUSCULAR | Status: DC | PRN

## 2020-01-17 MED ORDER — BUPIVACAINE HCL (PF) 0.25 % IJ SOLN
INTRAMUSCULAR | Status: AC
  Filled 2020-01-17: qty 30

## 2020-01-17 MED ORDER — CHLORHEXIDINE GLUCONATE CLOTH 2 % EX PADS: 6.0000 | MEDICATED_PAD | Freq: Once | CUTANEOUS | Status: DC

## 2020-01-17 MED ORDER — POLYETHYLENE GLYCOL 3350 17 GM/SCOOP PO POWD
1.0000 | Freq: Once | ORAL | Status: DC
  Filled 2020-01-17: qty 255

## 2020-01-17 MED ORDER — OXYCODONE HCL 5 MG PO TABS: 5.0000 mg | ORAL_TABLET | Freq: Once | ORAL | Status: DC | PRN

## 2020-01-17 MED ORDER — ONDANSETRON HCL 4 MG/2ML IJ SOLN
INTRAMUSCULAR | Status: DC | PRN
  Administered 2020-01-17: 4 mg via INTRAVENOUS

## 2020-01-17 MED ORDER — DEXAMETHASONE SODIUM PHOSPHATE 10 MG/ML IJ SOLN
INTRAMUSCULAR | Status: DC | PRN
  Administered 2020-01-17: 5 mg via INTRAVENOUS

## 2020-01-17 MED ORDER — MORPHINE SULFATE (PF) 2 MG/ML IV SOLN
1.0000 mg | INTRAVENOUS | Status: DC | PRN
  Administered 2020-01-18: 2 mg via INTRAVENOUS
  Filled 2020-01-17: qty 1

## 2020-01-17 MED ORDER — LIDOCAINE 2% (20 MG/ML) 5 ML SYRINGE
INTRAMUSCULAR | Status: AC
  Filled 2020-01-17: qty 5

## 2020-01-17 MED ORDER — METRONIDAZOLE 500 MG PO TABS
1000.0000 mg | ORAL_TABLET | ORAL | Status: DC
  Filled 2020-01-17: qty 2

## 2020-01-17 MED ORDER — LIDOCAINE 2% (20 MG/ML) 5 ML SYRINGE
INTRAMUSCULAR | Status: DC | PRN
  Administered 2020-01-17: 100 mg via INTRAVENOUS

## 2020-01-17 MED ORDER — HYDROCODONE-ACETAMINOPHEN 5-325 MG PO TABS
1.0000 | ORAL_TABLET | ORAL | Status: DC | PRN
  Administered 2020-01-17 – 2020-01-20 (×7): 2 via ORAL
  Filled 2020-01-17 (×7): qty 2

## 2020-01-17 MED ORDER — DEXAMETHASONE SODIUM PHOSPHATE 10 MG/ML IJ SOLN
INTRAMUSCULAR | Status: AC
  Filled 2020-01-17: qty 1

## 2020-01-17 MED ORDER — ALVIMOPAN 12 MG PO CAPS
12.0000 mg | ORAL_CAPSULE | Freq: Two times a day (BID) | ORAL | Status: DC
  Administered 2020-01-18 – 2020-01-20 (×5): 12 mg via ORAL
  Filled 2020-01-17 (×5): qty 1

## 2020-01-17 MED ORDER — MIDAZOLAM HCL 5 MG/5ML IJ SOLN
INTRAMUSCULAR | Status: DC | PRN
  Administered 2020-01-17: 2 mg via INTRAVENOUS

## 2020-01-17 MED ORDER — PROPOFOL 10 MG/ML IV BOLUS
INTRAVENOUS | Status: DC | PRN
  Administered 2020-01-17: 180 mg via INTRAVENOUS

## 2020-01-17 MED ORDER — KCL IN DEXTROSE-NACL 20-5-0.9 MEQ/L-%-% IV SOLN
INTRAVENOUS | Status: DC
  Filled 2020-01-17 (×2): qty 1000

## 2020-01-17 MED ORDER — ROCURONIUM BROMIDE 10 MG/ML (PF) SYRINGE
PREFILLED_SYRINGE | INTRAVENOUS | Status: AC
  Filled 2020-01-17: qty 10

## 2020-01-17 MED ORDER — ACETAMINOPHEN 500 MG PO TABS
ORAL_TABLET | ORAL | Status: AC
  Administered 2020-01-17: 1000 mg via ORAL
  Filled 2020-01-17: qty 2

## 2020-01-17 MED ORDER — PANTOPRAZOLE SODIUM 40 MG IV SOLR
40.0000 mg | INTRAVENOUS | Status: DC
  Administered 2020-01-17 – 2020-01-19 (×3): 40 mg via INTRAVENOUS
  Filled 2020-01-17 (×3): qty 40

## 2020-01-17 MED ORDER — SUGAMMADEX SODIUM 200 MG/2ML IV SOLN
INTRAVENOUS | Status: DC | PRN
  Administered 2020-01-17: 50 mg via INTRAVENOUS
  Administered 2020-01-17 (×2): 100 mg via INTRAVENOUS

## 2020-01-17 MED ORDER — ONDANSETRON HCL 4 MG PO TABS: 4.0000 mg | ORAL_TABLET | Freq: Four times a day (QID) | ORAL | Status: DC | PRN

## 2020-01-17 SURGICAL SUPPLY — 68 items
APL PRP STRL LF DISP 70% ISPRP (MISCELLANEOUS) ×1
BLADE CLIPPER SURG (BLADE) ×1 IMPLANT
BLADE SURG 10 STRL SS (BLADE) ×2 IMPLANT
CANISTER SUCT 3000ML PPV (MISCELLANEOUS) ×2 IMPLANT
CELLS DAT CNTRL 66122 CELL SVR (MISCELLANEOUS) ×1 IMPLANT
CHLORAPREP W/TINT 26 (MISCELLANEOUS) ×2 IMPLANT
COVER MAYO STAND STRL (DRAPES) ×4 IMPLANT
COVER SURGICAL LIGHT HANDLE (MISCELLANEOUS) ×4 IMPLANT
COVER WAND RF STERILE (DRAPES) ×2 IMPLANT
DRAPE HALF SHEET 40X57 (DRAPES) ×2 IMPLANT
DRAPE UTILITY XL STRL (DRAPES) ×2 IMPLANT
DRAPE WARM FLUID 44X44 (DRAPES) ×2 IMPLANT
DRSG OPSITE POSTOP 4X10 (GAUZE/BANDAGES/DRESSINGS) IMPLANT
DRSG OPSITE POSTOP 4X8 (GAUZE/BANDAGES/DRESSINGS) ×1 IMPLANT
DRSG TEGADERM 4X4.75 (GAUZE/BANDAGES/DRESSINGS) ×1 IMPLANT
ELECT BLADE 6.5 EXT (BLADE) ×2 IMPLANT
ELECT CAUTERY BLADE 6.4 (BLADE) ×2 IMPLANT
ELECT REM PT RETURN 9FT ADLT (ELECTROSURGICAL) ×2
ELECTRODE REM PT RTRN 9FT ADLT (ELECTROSURGICAL) ×1 IMPLANT
GAUZE 4X4 16PLY RFD (DISPOSABLE) ×1 IMPLANT
GAUZE SPONGE 2X2 8PLY STRL LF (GAUZE/BANDAGES/DRESSINGS) IMPLANT
GLOVE BIO SURGEON STRL SZ7.5 (GLOVE) ×4 IMPLANT
GOWN STRL REUS W/ TWL LRG LVL3 (GOWN DISPOSABLE) ×8 IMPLANT
GOWN STRL REUS W/TWL LRG LVL3 (GOWN DISPOSABLE) ×16
HANDLE SUCTION POOLE (INSTRUMENTS) ×1 IMPLANT
KIT BASIN OR (CUSTOM PROCEDURE TRAY) ×2 IMPLANT
KIT SIGMOIDOSCOPE (SET/KITS/TRAYS/PACK) ×2 IMPLANT
KIT TURNOVER KIT B (KITS) ×2 IMPLANT
LEGGING LITHOTOMY PAIR STRL (DRAPES) ×2 IMPLANT
NS IRRIG 1000ML POUR BTL (IV SOLUTION) ×4 IMPLANT
PACK COLON (CUSTOM PROCEDURE TRAY) ×1 IMPLANT
PAD ARMBOARD 7.5X6 YLW CONV (MISCELLANEOUS) ×4 IMPLANT
PENCIL SMOKE EVACUATOR (MISCELLANEOUS) ×4 IMPLANT
RETRACTOR WND ALEXIS 18 MED (MISCELLANEOUS) IMPLANT
RTRCTR WOUND ALEXIS 18CM MED (MISCELLANEOUS) ×2
SCISSORS LAP 5X35 DISP (ENDOMECHANICALS) ×2 IMPLANT
SET TUBE SMOKE EVAC HIGH FLOW (TUBING) ×2 IMPLANT
SHEARS HARMONIC ACE PLUS 36CM (ENDOMECHANICALS) ×2 IMPLANT
SLEEVE ENDOPATH XCEL 5M (ENDOMECHANICALS) ×4 IMPLANT
SPECIMEN JAR LARGE (MISCELLANEOUS) ×2 IMPLANT
SPONGE GAUZE 2X2 STER 10/PKG (GAUZE/BANDAGES/DRESSINGS) ×1
SPONGE LAP 18X18 RF (DISPOSABLE) ×2 IMPLANT
STAPLER CUT CVD 40MM GREEN (STAPLE) ×1 IMPLANT
STAPLER PROXIMATE 75MM BLUE (STAPLE) ×1 IMPLANT
STAPLER VISISTAT 35W (STAPLE) ×2 IMPLANT
SUCTION POOLE HANDLE (INSTRUMENTS) ×2
SURGILUBE 2OZ TUBE FLIPTOP (MISCELLANEOUS) ×2 IMPLANT
SUT PDS AB 1 CT  36 (SUTURE) ×4
SUT PDS AB 1 CT 36 (SUTURE) ×2 IMPLANT
SUT PROLENE 2 0 CT2 30 (SUTURE) IMPLANT
SUT PROLENE 2 0 KS (SUTURE) IMPLANT
SUT PROLENE 2 0 SH 30 (SUTURE) IMPLANT
SUT SILK 2 0 (SUTURE)
SUT SILK 2 0 SH CR/8 (SUTURE) ×2 IMPLANT
SUT SILK 2-0 18XBRD TIE 12 (SUTURE) ×1 IMPLANT
SUT SILK 3 0 (SUTURE) ×2
SUT SILK 3 0 SH CR/8 (SUTURE) ×2 IMPLANT
SUT SILK 3-0 18XBRD TIE 12 (SUTURE) ×1 IMPLANT
SYR BULB IRRIGATION 50ML (SYRINGE) ×4 IMPLANT
TOWEL GREEN STERILE (TOWEL DISPOSABLE) ×4 IMPLANT
TRAY FOLEY MTR SLVR 16FR STAT (SET/KITS/TRAYS/PACK) ×2 IMPLANT
TROCAR XCEL 12X100 BLDLESS (ENDOMECHANICALS) IMPLANT
TROCAR XCEL BLUNT TIP 100MML (ENDOMECHANICALS) IMPLANT
TROCAR XCEL NON-BLD 11X100MML (ENDOMECHANICALS) IMPLANT
TROCAR XCEL NON-BLD 5MMX100MML (ENDOMECHANICALS) ×4 IMPLANT
TUBE CONNECTING 12X1/4 (SUCTIONS) ×4 IMPLANT
WATER STERILE IRR 1000ML POUR (IV SOLUTION) ×2 IMPLANT
YANKAUER SUCT BULB TIP NO VENT (SUCTIONS) ×4 IMPLANT

## 2020-01-17 NOTE — Transfer of Care (Signed)
Immediate Anesthesia Transfer of Care Note  Patient: Matthew Mack  Procedure(s) Performed: LAPAROSCOPIC ASSISTED SIGMOID COLECTOMY (N/A )  Patient Location: PACU  Anesthesia Type:General  Level of Consciousness: awake, alert , oriented and drowsy  Airway & Oxygen Therapy: Patient Spontanous Breathing and Patient connected to nasal cannula oxygen  Post-op Assessment: Report given to RN and Post -op Vital signs reviewed and stable  Post vital signs: Reviewed and stable  Last Vitals:  Vitals Value Taken Time  BP 137/90 01/17/20 1641  Temp    Pulse 65 01/17/20 1643  Resp 13 01/17/20 1643  SpO2 99 % 01/17/20 1643  Vitals shown include unvalidated device data.  Last Pain:  Vitals:   01/17/20 1218  PainSc: 3          Complications: No apparent anesthesia complications

## 2020-01-17 NOTE — Op Note (Signed)
01/17/2020  4:35 PM  PATIENT:  Matthew Mack  54 y.o. male  PRE-OPERATIVE DIAGNOSIS:  SIGMOID DIVERTICULITIS  POST-OPERATIVE DIAGNOSIS:  SIGMOID DIVERTICULITIS  PROCEDURE:  Procedure(s): LAPAROSCOPIC ASSISTED SIGMOID COLECTOMY WITH MOBILIZATION OF THE SPLENIC FLEXURE RIGID SIGMOIDOSCOPY  SURGEON:  Surgeon(s) and Role:    * Jovita Kussmaul, MD - Primary    * White, Sharon Mt, MD - Assisting  PHYSICIAN ASSISTANT:   ASSISTANTS: Dr. Dema Severin   ANESTHESIA:   local and general  EBL:  75 mL   BLOOD ADMINISTERED:none  DRAINS: none   LOCAL MEDICATIONS USED:  MARCAINE     SPECIMEN:  Source of Specimen:  sigmoid colon  DISPOSITION OF SPECIMEN:  PATHOLOGY  COUNTS:  YES  TOURNIQUET:  * No tourniquets in log *  DICTATION: .Dragon Dictation   After informed consent was obtained the patient was brought to the operating room and placed in the supine position on the operating table.  After adequate induction of general anesthesia the patient was moved into lithotomy position and all pressure points were padded.  The patient's abdomen and perineal area were prepped with ChloraPrep and Betadine, allowed to dry, and draped in usual sterile manner.  An appropriate timeout was performed.  A site was chosen to access the abdominal cavity in the right upper quadrant.  This area was infiltrated with quarter percent Marcaine.  A small stab incision was made with a 15 blade knife.  A 5 mm Optiview port and camera were used to bluntly dissected the layers of the abdominal wall under direct vision until access was gained to the abdominal cavity.  The abdomen was insufflated with carbon dioxide without difficulty.  The camera was then placed through the port and the abdomen was inspected.  The sigmoid colon was readily identified.  A second port was placed in the right lower quadrant under direct vision and a third port in the left upper quadrant also under direct vision.  The sigmoid and left colon were  then mobilized by incising its retroperitoneal attachment along the white line of Toldt with the harmonic scalpel.  Dissection was carried superiorly so that the splenic flexure was mobilized.  Once there was good mobilization of the left colon then at that point we decided to open.  During the dissection the left ureter was identified and was well out of the way of the dissection area.  A lower midline incision was made below the umbilicus with a 10 blade knife.  The incision was carried through the skin and subcutaneous tissue sharply with electrocautery until the linea alba is identified.  The linea alba was incised with the electrocautery.  The preperitoneal space was dissected sharply until the peritoneum was opened and access was gained to the abdominal cavity.  The rest of the incision was opened under direct vision.  A wound protector was deployed.  The sigmoid colon was readily identified and able to be brought up into the wound.  There was a definite segment of diseased sigmoid colon.  We identified points above and below the diseased segment where the colon and rectum was normal.  The mesentery at each of these points was opened sharply with the electrocautery.  The upper segment was divided with a single firing of a GIA-75 stapler.  The lower segment was divided with a single firing of the contour green load stapler.  The mesentery was taken down sharply with the harmonic scalpel.  Once this was accomplished the sigmoid colon was removed and marked  with a stitch on the proximal segment.  The rectum and proximal segment of colon reached each other easily with no tension.  The proximal staple line was excised sharply with the electrocautery.  We then sized the colon to a 25 mm EEA sizer.  A 2-0 Prolene pursestring stitch was then placed around the proximal colon opening.  The anvil of the 25 mm stapler was placed into the lumen of the colon and the pursestring stitch was cinched around the anvil and tied  down.  Next Dr. Cliffton Asters was able to go below and gently dilated the rectum and then bring a 25 mm EEA stapler up just anterior to the staple line of the rectum.  The spike was deployed.  The anvil and spike were joined and the stapler was closed into the green zone.  This was held for approximately 1 minute and then the stapler was fired.  Another minute was allowed to pass and then the stapler was opened and removed from the patient.  The staple line appeared to be healthy and intact.  I reinforced the anterior staple line with multiple 2-0 silk Lembert stitches.  A soft bowel clamp was placed above the anastomosis.  The pelvis was filled with saline and Dr. Cliffton Asters then performed a rigid sigmoidoscopy.  There was no bubbling or evidence of leak.  There was good distention of the colon above the staple line.  At this point the abdomen was then irrigated with copious amounts of saline and the insufflation of the colon was removed.  The rest of the abdomen had been inspected and no other abnormalities were noted.  Next all gowns and gloves and drapes were changed for new.  The anterior abdominal wall fascia was then closed with 2 running #1 double-stranded looped PDS sutures.  The subcutaneous tissue was irrigated with copious amounts of saline.  The skin incisions were then closed with staples.  Sterile dressings were applied.  The patient tolerated procedure well.  At the end of the case all needle sponge and instrument counts were correct.  The patient was then awakened and taken to recovery in stable condition. The assistant was instrumental for completion of the case.  PLAN OF CARE: Admit to inpatient   PATIENT DISPOSITION:  PACU - hemodynamically stable.   Delay start of Pharmacological VTE agent (>24hrs) due to surgical blood loss or risk of bleeding: no

## 2020-01-17 NOTE — Anesthesia Procedure Notes (Signed)
Procedure Name: Intubation Date/Time: 01/17/2020 2:19 PM Performed by: Trinna Post., CRNA Pre-anesthesia Checklist: Patient identified, Emergency Drugs available, Suction available, Patient being monitored and Timeout performed Patient Re-evaluated:Patient Re-evaluated prior to induction Oxygen Delivery Method: Circle system utilized Preoxygenation: Pre-oxygenation with 100% oxygen Induction Type: IV induction Ventilation: Mask ventilation without difficulty Laryngoscope Size: Mac and 4 Grade View: Grade I Tube type: Oral Tube size: 7.5 mm Number of attempts: 1 Airway Equipment and Method: Stylet Placement Confirmation: ETT inserted through vocal cords under direct vision,  positive ETCO2 and breath sounds checked- equal and bilateral Secured at: 22 cm Tube secured with: Tape Dental Injury: Teeth and Oropharynx as per pre-operative assessment

## 2020-01-17 NOTE — Progress Notes (Signed)
Patient stated he did not take the 1500 dose yesterday of Neomycin as ordered because the first dose made him nauseous. MD made aware.

## 2020-01-17 NOTE — Interval H&P Note (Signed)
History and Physical Interval Note:  01/17/2020 1:35 PM  Matthew Mack  has presented today for surgery, with the diagnosis of SIGMOID DIVERTICULITIS.  The various methods of treatment have been discussed with the patient and family. After consideration of risks, benefits and other options for treatment, the patient has consented to  Procedure(s): LAPAROSCOPIC ASSISTED SIGMOID COLECTOMY (N/A) as a surgical intervention.  The patient's history has been reviewed, patient examined, no change in status, stable for surgery.  I have reviewed the patient's chart and labs.  Questions were answered to the patient's satisfaction.     Chevis Pretty III

## 2020-01-17 NOTE — H&P (Signed)
Matthew Mack  Location: [facility] Patient #: [id number] DOB: 1966/03/28 Single / Language: [name] / Race: Refused to Report/Unreported Male   History of Present Illness  The patient is a 54 year old male who presents for a follow-up for Abdominal pain. The patient is a 54 year old white male who was recently hospitalized with sigmoid colon diverticulitis with evidence of contained perforation. He responded well to IV antibiotics and improved and was able to be discharged home. He has completed 2 weeks of oral antibiotics at home. He denies any fevers or chills. He has very minor suprapubic abdominal soreness. His appetite is good and his bowels are working normally. He has had multiple episodes similar to this since he was first diagnosed about 5 years ago. He is otherwise in good health.   Allergies Penicillins  Sulfa Drugs  Allergies Reconciled   Medication History  No Current Medications Medications Reconciled    Review of Systems General Not Present- Appetite Loss, Chills, Fatigue, Fever, Night Sweats, Weight Gain and Weight Loss. Note: All other systems negative (unless as noted in HPI & included Review of Systems) Skin Not Present- Change in Wart/Mole, Dryness, Hives, Jaundice, New Lesions, Non-Healing Wounds, Rash and Ulcer. HEENT Not Present- Earache, Hearing Loss, Hoarseness, Nose Bleed, Oral Ulcers, Ringing in the Ears, Seasonal Allergies, Sinus Pain, Sore Throat, Visual Disturbances, Wears glasses/contact lenses and Yellow Eyes. Respiratory Not Present- Bloody sputum, Chronic Cough, Difficulty Breathing, Snoring and Wheezing. Breast Not Present- Breast Mass, Breast Pain, Nipple Discharge and Skin Changes. Cardiovascular Not Present- Chest Pain, Difficulty Breathing Lying Down, Leg Cramps, Palpitations, Rapid Heart Rate, Shortness of Breath and Swelling of Extremities. Gastrointestinal Not Present- Abdominal Pain, Bloating, Bloody Stool, Change in  Bowel Habits, Chronic diarrhea, Constipation, Difficulty Swallowing, Excessive gas, Gets full quickly at meals, Hemorrhoids, Indigestion, Nausea, Rectal Pain and Vomiting. Male Genitourinary Not Present- Blood in Urine, Change in Urinary Stream, Frequency, Impotence, Nocturia, Painful Urination, Urgency and Urine Leakage. Musculoskeletal Not Present- Back Pain, Joint Pain, Joint Stiffness, Muscle Pain, Muscle Weakness and Swelling of Extremities. Neurological Not Present- Decreased Memory, Fainting, Headaches, Numbness, Seizures, Tingling, Tremor, Trouble walking and Weakness. Psychiatric Not Present- Anxiety, Bipolar, Change in Sleep Pattern, Depression, Fearful and Frequent crying. Endocrine Not Present- Cold Intolerance, Excessive Hunger, Hair Changes, Heat Intolerance and New Diabetes. Hematology Not Present- Easy Bruising, Excessive bleeding, Gland problems, HIV and Persistent Infections.  Vitals  Weight: 203.9 lb Height: 69in Body Surface Area: 2.08 m Body Mass Index: 30.11 kg/m  Temp.: 97.74F(Oral)  Pulse: 84 (Regular)  BP: 132/84 (Sitting, Left Arm, Standard)       Physical Exam  General Mental Status-Alert. General Appearance-Consistent with stated age. Hydration-Well hydrated. Voice-Normal.  Head and Neck Head-normocephalic, atraumatic with no lesions or palpable masses. Trachea-midline. Thyroid Gland Characteristics - normal size and consistency.  Eye Eyeball - Bilateral-Extraocular movements intact. Sclera/Conjunctiva - Bilateral-No scleral icterus.  Chest and Lung Exam Chest and lung exam reveals -quiet, even and easy respiratory effort with no use of accessory muscles and on auscultation, normal breath sounds, no adventitious sounds and normal vocal resonance. Inspection Chest Wall - Normal. Back - normal.  Cardiovascular Cardiovascular examination reveals -normal heart sounds, regular rate and rhythm with no murmurs and  normal pedal pulses bilaterally.  Abdomen Inspection Inspection of the abdomen reveals - No Hernias. Skin - Scar - no surgical scars. Palpation/Percussion Palpation and Percussion of the abdomen reveal - Soft, Non Tender, No Rebound tenderness, No Rigidity (guarding) and No hepatosplenomegaly. Auscultation Auscultation of the  abdomen reveals - Bowel sounds normal.  Neurologic Neurologic evaluation reveals -alert and oriented x 3 with no impairment of recent or remote memory. Mental Status-Normal.  Musculoskeletal Normal Exam - Left-Upper Extremity Strength Normal and Lower Extremity Strength Normal. Normal Exam - Right-Upper Extremity Strength Normal and Lower Extremity Strength Normal.  Lymphatic Head & Neck  General Head & Neck Lymphatics: Bilateral - Description - Normal. Axillary  General Axillary Region: Bilateral - Description - Normal. Tenderness - Non Tender. Femoral & Inguinal  Generalized Femoral & Inguinal Lymphatics: Bilateral - Description - Normal. Tenderness - Non Tender.    Assessment & Plan  DIVERTICULITIS, COLON (K57.32) Impression: The patient was recently hospitalized with sigmoid colon diverticulitis with evidence of contained perforation. He never developed an abscess. He has had multiple episodes of this over the last 5 years. Because of this I think it would be reasonable at this point to consider an elective resection of the sigmoid colon. I have discussed with him in detail the risks and benefits of the operation as well as some of the technical aspects including the risk of leak and the need for a bag and he understands and wishes to proceed. We will consult gastroenterology and have them evaluate the rest of his colon prior to surgery. I think he is a good laparoscopic assisted candidate. Current Plans Referred to Gastroenterology, for evaluation and follow up Psychologist, sport and exercise). Routine.

## 2020-01-17 NOTE — Anesthesia Postprocedure Evaluation (Signed)
Anesthesia Post Note  Patient: Matthew Mack  Procedure(s) Performed: LAPAROSCOPIC ASSISTED SIGMOID COLECTOMY (N/A )     Patient location during evaluation: PACU Anesthesia Type: General Level of consciousness: awake and alert and oriented Pain management: pain level controlled Vital Signs Assessment: post-procedure vital signs reviewed and stable Respiratory status: spontaneous breathing, nonlabored ventilation and respiratory function stable Cardiovascular status: blood pressure returned to baseline Postop Assessment: no apparent nausea or vomiting Anesthetic complications: no    Last Vitals:  Vitals:   01/17/20 1203 01/17/20 1641  BP: (!) 138/98 137/90  Pulse: 72 78  Resp: 17 11  Temp: 36.7 C 36.9 C  SpO2: 96% 97%    Last Pain:  Vitals:   01/17/20 1641  PainSc: Asleep                 Kaylyn Layer

## 2020-01-18 LAB — BASIC METABOLIC PANEL
Anion gap: 8 (ref 5–15)
BUN: 9 mg/dL (ref 6–20)
CO2: 25 mmol/L (ref 22–32)
Calcium: 8.8 mg/dL — ABNORMAL LOW (ref 8.9–10.3)
Chloride: 105 mmol/L (ref 98–111)
Creatinine, Ser: 1.27 mg/dL — ABNORMAL HIGH (ref 0.61–1.24)
GFR calc Af Amer: >60 mL/min (ref >60)
GFR calc non Af Amer: >60 mL/min (ref >60)
Glucose, Bld: 143 mg/dL — ABNORMAL HIGH (ref 70–99)
Potassium: 4.4 mmol/L (ref 3.5–5.1)
Sodium: 138 mmol/L (ref 135–145)

## 2020-01-18 LAB — CBC
HCT: 45.1 % (ref 39.0–52.0)
Hemoglobin: 15.4 g/dL (ref 13.0–17.0)
MCH: 31.8 pg (ref 26.0–34.0)
MCHC: 34.1 g/dL (ref 30.0–36.0)
MCV: 93 fL (ref 80.0–100.0)
Platelets: 213 10*3/uL (ref 150–400)
RBC: 4.85 MIL/uL (ref 4.22–5.81)
RDW: 12.1 % (ref 11.5–15.5)
WBC: 7.2 10*3/uL (ref 4.0–10.5)
nRBC: 0 % (ref 0.0–0.2)

## 2020-01-18 MED ORDER — CHLORHEXIDINE GLUCONATE CLOTH 2 % EX PADS
6.0000 | MEDICATED_PAD | Freq: Every day | CUTANEOUS | Status: DC
  Administered 2020-01-18 – 2020-01-20 (×2): 6 via TOPICAL

## 2020-01-18 NOTE — Progress Notes (Signed)
1 Day Post-Op   Subjective/Chief Complaint: Complains of soreness. Otherwise ok   Objective: Vital signs in last 24 hours: Temp:  [97.2 F (36.2 C)-98.9 F (37.2 C)] 98.2 F (36.8 C) (04/09 0616) Pulse Rate:  [64-92] 75 (04/09 0616) Resp:  [11-18] 18 (04/09 0616) BP: (116-146)/(74-98) 116/82 (04/09 0616) SpO2:  [92 %-98 %] 98 % (04/09 0616) Weight:  [97.5 kg-101.2 kg] 101.2 kg (04/09 0500) Last BM Date: 01/17/20  Intake/Output from previous day: 04/08 0701 - 04/09 0700 In: 1370.4 [P.O.:480; I.V.:890.4] Out: 1400 [Urine:1325; Blood:75] Intake/Output this shift: No intake/output data recorded.  General appearance: alert and cooperative Resp: clear to auscultation bilaterally Cardio: regular rate and rhythm GI: soft, appropriately tender. incision looks good  Lab Results:  Recent Labs    01/18/20 0208  WBC 7.2  HGB 15.4  HCT 45.1  PLT 213   BMET Recent Labs    01/18/20 0208  NA 138  K 4.4  CL 105  CO2 25  GLUCOSE 143*  BUN 9  CREATININE 1.27*  CALCIUM 8.8*   PT/INR No results for input(s): LABPROT, INR in the last 72 hours. ABG No results for input(s): PHART, HCO3 in the last 72 hours.  Invalid input(s): PCO2, PO2  Studies/Results: No results found.  Anti-infectives: Anti-infectives (From admission, onward)   Start     Dose/Rate Route Frequency Ordered Stop   01/17/20 1400  neomycin (MYCIFRADIN) tablet 1,000 mg  Status:  Discontinued     1,000 mg Oral 3 times per day 01/17/20 1151 01/17/20 1258   01/17/20 1400  metroNIDAZOLE (FLAGYL) tablet 1,000 mg  Status:  Discontinued     1,000 mg Oral 3 times per day 01/17/20 1151 01/17/20 1258      Assessment/Plan: s/p Procedure(s): LAPAROSCOPIC ASSISTED SIGMOID COLECTOMY (N/A) Advance diet. Stay on fulls until bowel function returns D/c foley tomorrow Ambulate Pod 1  LOS: 1 day    Chevis Pretty III 01/18/2020

## 2020-01-19 NOTE — Plan of Care (Signed)

## 2020-01-19 NOTE — Progress Notes (Signed)
2 Days Post-Op   Subjective/Chief Complaint: No complaints. Feels better   Objective: Vital signs in last 24 hours: Temp:  [97.5 F (36.4 C)-98.1 F (36.7 C)] 97.6 F (36.4 C) (04/10 0437) Pulse Rate:  [64-72] 64 (04/10 0437) Resp:  [17-18] 17 (04/10 0437) BP: (123-136)/(80-98) 123/80 (04/10 0437) SpO2:  [95 %-100 %] 96 % (04/10 0437) Last BM Date: 01/17/20  Intake/Output from previous day: 04/09 0701 - 04/10 0700 In: 1094.6 [P.O.:897; I.V.:197.6] Out: 3150 [Urine:3150] Intake/Output this shift: No intake/output data recorded.  General appearance: alert and cooperative Resp: clear to auscultation bilaterally Cardio: regular rate and rhythm GI: soft, mild tenderness. good bs. incision looks good  Lab Results:  Recent Labs    01/18/20 0208  WBC 7.2  HGB 15.4  HCT 45.1  PLT 213   BMET Recent Labs    01/18/20 0208  NA 138  K 4.4  CL 105  CO2 25  GLUCOSE 143*  BUN 9  CREATININE 1.27*  CALCIUM 8.8*   PT/INR No results for input(s): LABPROT, INR in the last 72 hours. ABG No results for input(s): PHART, HCO3 in the last 72 hours.  Invalid input(s): PCO2, PO2  Studies/Results: No results found.  Anti-infectives: Anti-infectives (From admission, onward)   Start     Dose/Rate Route Frequency Ordered Stop   01/17/20 1400  neomycin (MYCIFRADIN) tablet 1,000 mg  Status:  Discontinued     1,000 mg Oral 3 times per day 01/17/20 1151 01/17/20 1258   01/17/20 1400  metroNIDAZOLE (FLAGYL) tablet 1,000 mg  Status:  Discontinued     1,000 mg Oral 3 times per day 01/17/20 1151 01/17/20 1258      Assessment/Plan: s/p Procedure(s): LAPAROSCOPIC ASSISTED SIGMOID COLECTOMY (N/A) Advance diet  Continue entereg Ambulate POD 2  LOS: 2 days    Chevis Pretty III 01/19/2020

## 2020-01-20 LAB — CBC
HCT: 43.3 % (ref 39.0–52.0)
Hemoglobin: 14.9 g/dL (ref 13.0–17.0)
MCH: 32.3 pg (ref 26.0–34.0)
MCHC: 34.4 g/dL (ref 30.0–36.0)
MCV: 93.9 fL (ref 80.0–100.0)
Platelets: 192 10*3/uL (ref 150–400)
RBC: 4.61 MIL/uL (ref 4.22–5.81)
RDW: 12.2 % (ref 11.5–15.5)
WBC: 5.4 10*3/uL (ref 4.0–10.5)
nRBC: 0 % (ref 0.0–0.2)

## 2020-01-20 MED ORDER — HYDROCODONE-ACETAMINOPHEN 5-325 MG PO TABS: 1.0000 | ORAL_TABLET | Freq: Four times a day (QID) | ORAL | 0 refills | Status: AC | PRN

## 2020-01-20 NOTE — Discharge Summary (Signed)
Physician Discharge Summary  Patient ID: Matthew Mack MRN: [id number] DOB/AGE: 52-12-1965 52 y.o.  Admit date: 01/17/2020 Discharge date: 01/20/2020  Admission Diagnoses:  Discharge Diagnoses:  Active Problems:   Sigmoid diverticulitis   Discharged Condition: good  Hospital Course: the pt underwent lap assisted sigmoid colectomy. He tolerated surgery well. His course was uneventful. On pod 3 he was ready for d/c home  Consults: None  Significant Diagnostic Studies: none  Treatments: surgery: as above  Discharge Exam: Blood pressure 138/84, pulse 65, temperature 98.1 F (36.7 C), temperature source Oral, resp. rate 16, height 5\' 10"  (1.778 m), weight 101.2 kg, SpO2 96 %. General appearance: alert and cooperative Resp: clear to auscultation bilaterally Cardio: regular rate and rhythm GI: soft, minimal tenderness. incisions look good  Disposition: Discharge disposition: 01-Home or Self Care       Discharge Instructions    Call MD for:  difficulty breathing, headache or visual disturbances   Complete by: As directed    Call MD for:  extreme fatigue   Complete by: As directed    Call MD for:  hives   Complete by: As directed    Call MD for:  persistant dizziness or light-headedness   Complete by: As directed    Call MD for:  persistant nausea and vomiting   Complete by: As directed    Call MD for:  redness, tenderness, or signs of infection (pain, swelling, redness, odor or green/yellow discharge around incision site)   Complete by: As directed    Call MD for:  severe uncontrolled pain   Complete by: As directed    Call MD for:  temperature >100.4   Complete by: As directed    Diet - low sodium heart healthy   Complete by: As directed    Discharge instructions   Complete by: As directed    May shower. No heavy lifting. Diet as tolerated   Increase activity slowly   Complete by: As directed    No wound care   Complete by: As directed      Allergies as of  01/20/2020      Reactions   Penicillins Hives   Sulfa Antibiotics Itching      Medication List    TAKE these medications   HYDROcodone-acetaminophen 5-325 MG tablet Commonly known as: NORCO/VICODIN Take 1-2 tablets by mouth every 6 (six) hours as needed for moderate pain.   ibuprofen 200 MG tablet Commonly known as: ADVIL Take 400 mg by mouth every 6 (six) hours as needed for moderate pain.   oxyCODONE 5 MG immediate release tablet Commonly known as: Oxy IR/ROXICODONE Take 1 tablet (5 mg total) by mouth every 6 (six) hours as needed for moderate pain.      Follow-up Information    03/21/2020 III, MD Follow up in 2 week(s).   Specialty: General Surgery Contact information: [address] [place] [phone]           Signed: [phone] III 01/20/2020, 8:31 AM

## 2020-01-20 NOTE — Plan of Care (Signed)
  Problem: Activity: Goal: Risk for activity intolerance will decrease Outcome: Adequate for Discharge   Problem: Elimination: Goal: Will not experience complications related to bowel motility Outcome: Adequate for Discharge   Problem: Pain Managment: Goal: General experience of comfort will improve Outcome: Adequate for Discharge   

## 2020-01-20 NOTE — Progress Notes (Signed)
Nsg Discharge Note  Admit Date:  01/17/2020 Discharge date: 01/20/2020   Matthew Mack to be D/C'd  per MD order.  AVS completed.  Discharge Medication: Allergies as of 01/20/2020      Reactions   Penicillins Hives   Sulfa Antibiotics Itching      Medication List    TAKE these medications   HYDROcodone-acetaminophen 5-325 MG tablet Commonly known as: NORCO/VICODIN Take 1-2 tablets by mouth every 6 (six) hours as needed for moderate pain.   ibuprofen 200 MG tablet Commonly known as: ADVIL Take 400 mg by mouth every 6 (six) hours as needed for moderate pain.   oxyCODONE 5 MG immediate release tablet Commonly known as: Oxy IR/ROXICODONE Take 1 tablet (5 mg total) by mouth every 6 (six) hours as needed for moderate pain.       Discharge Assessment: Vitals:   01/20/20 0605 01/20/20 0605  BP: 138/84 138/84  Pulse: 65 65  Resp: 16 16  Temp: 98.1 F (36.7 C) 98.1 F (36.7 C)  SpO2: 96% 96%   Skin clean, dry and intact without evidence of skin break down, no evidence of skin tears noted. IV catheter discontinued intact. Site without signs and symptoms of complications - no redness or edema noted at insertion site, patient denies c/o pain - only slight tenderness at site.  Dressing with slight pressure applied.  D/c Instructions-Education: Discharge instructions given to patient/family with verbalized understanding. D/c education completed with patient/family including follow up instructions, medication list, d/c activities limitations if indicated, with other d/c instructions as indicated by MD - patient able to verbalize understanding, all questions fully answered. Patient instructed to return to ED, call 911, or call MD for any changes in condition.  Patient escorted via WC, and D/C home via private auto.  Matthew Mack, Matthew Pillar, RN 01/20/2020 12:33 PM

## 2020-01-20 NOTE — Progress Notes (Signed)
3 Days Post-Op   Subjective/Chief Complaint: No complaints. Passing flatus and having bm's   Objective: Vital signs in last 24 hours: Temp:  [98 F (36.7 C)-98.1 F (36.7 C)] 98.1 F (36.7 C) (04/11 0605) Pulse Rate:  [65-75] 65 (04/11 0605) Resp:  [16-17] 16 (04/11 0605) BP: (130-138)/(84-95) 138/84 (04/11 0605) SpO2:  [95 %-97 %] 96 % (04/11 0605) Last BM Date: 01/17/20  Intake/Output from previous day: 04/10 0701 - 04/11 0700 In: 840 [P.O.:840] Out: -  Intake/Output this shift: No intake/output data recorded.  General appearance: alert and cooperative Resp: clear to auscultation bilaterally Cardio: regular rate and rhythm GI: soft, minimal tenderness. good bs. incisions look good  Lab Results:  Recent Labs    01/18/20 0208  WBC 7.2  HGB 15.4  HCT 45.1  PLT 213   BMET Recent Labs    01/18/20 0208  NA 138  K 4.4  CL 105  CO2 25  GLUCOSE 143*  BUN 9  CREATININE 1.27*  CALCIUM 8.8*   PT/INR No results for input(s): LABPROT, INR in the last 72 hours. ABG No results for input(s): PHART, HCO3 in the last 72 hours.  Invalid input(s): PCO2, PO2  Studies/Results: No results found.  Anti-infectives: Anti-infectives (From admission, onward)   Start     Dose/Rate Route Frequency Ordered Stop   01/17/20 1400  neomycin (MYCIFRADIN) tablet 1,000 mg  Status:  Discontinued     1,000 mg Oral 3 times per day 01/17/20 1151 01/17/20 1258   01/17/20 1400  metroNIDAZOLE (FLAGYL) tablet 1,000 mg  Status:  Discontinued     1,000 mg Oral 3 times per day 01/17/20 1151 01/17/20 1258      Assessment/Plan: s/p Procedure(s): LAPAROSCOPIC ASSISTED SIGMOID COLECTOMY (N/A) Advance diet  Check cbc. If ok then ready for d/c Pod 3  LOS: 3 days    Chevis Pretty III 01/20/2020

## 2020-01-21 LAB — SURGICAL PATHOLOGY

## 2020-03-25 ENCOUNTER — Emergency Department (HOSPITAL_COMMUNITY): Payer: BC Managed Care – PPO

## 2020-03-25 ENCOUNTER — Other Ambulatory Visit: Payer: Self-pay

## 2020-03-25 ENCOUNTER — Encounter (HOSPITAL_COMMUNITY): Payer: Self-pay | Admitting: Emergency Medicine

## 2020-03-25 ENCOUNTER — Inpatient Hospital Stay (HOSPITAL_COMMUNITY)
Admission: AD | Admit: 2020-03-25 | Discharge: 2020-03-30 | DRG: 871 | Disposition: A | Discharge status: Home / Self Care | Payer: BC Managed Care – PPO | Attending: Internal Medicine | Admitting: Internal Medicine

## 2020-03-25 DIAGNOSIS — K5732 Diverticulitis of large intestine without perforation or abscess without bleeding: Secondary | ICD-10-CM

## 2020-03-25 DIAGNOSIS — Z20822 Contact with and (suspected) exposure to covid-19: Secondary | ICD-10-CM | POA: Diagnosis not present

## 2020-03-25 DIAGNOSIS — A4189 Other specified sepsis: Principal | ICD-10-CM | POA: Diagnosis present

## 2020-03-25 DIAGNOSIS — R6520 Severe sepsis without septic shock: Secondary | ICD-10-CM | POA: Diagnosis present

## 2020-03-25 DIAGNOSIS — U071 COVID-19: Secondary | ICD-10-CM | POA: Diagnosis not present

## 2020-03-25 DIAGNOSIS — A419 Sepsis, unspecified organism: Secondary | ICD-10-CM | POA: Diagnosis present

## 2020-03-25 DIAGNOSIS — R509 Fever, unspecified: Secondary | ICD-10-CM | POA: Diagnosis not present

## 2020-03-25 DIAGNOSIS — R0902 Hypoxemia: Secondary | ICD-10-CM | POA: Diagnosis not present

## 2020-03-25 DIAGNOSIS — J9601 Acute respiratory failure with hypoxia: Secondary | ICD-10-CM | POA: Diagnosis present

## 2020-03-25 DIAGNOSIS — Z833 Family history of diabetes mellitus: Secondary | ICD-10-CM | POA: Diagnosis not present

## 2020-03-25 DIAGNOSIS — Z880 Allergy status to penicillin: Secondary | ICD-10-CM | POA: Diagnosis not present

## 2020-03-25 DIAGNOSIS — Z882 Allergy status to sulfonamides status: Secondary | ICD-10-CM

## 2020-03-25 DIAGNOSIS — R079 Chest pain, unspecified: Secondary | ICD-10-CM | POA: Diagnosis not present

## 2020-03-25 DIAGNOSIS — R0602 Shortness of breath: Secondary | ICD-10-CM | POA: Diagnosis present

## 2020-03-25 DIAGNOSIS — E785 Hyperlipidemia, unspecified: Secondary | ICD-10-CM | POA: Diagnosis not present

## 2020-03-25 DIAGNOSIS — Z03818 Encounter for observation for suspected exposure to other biological agents ruled out: Secondary | ICD-10-CM | POA: Diagnosis not present

## 2020-03-25 DIAGNOSIS — J1282 Pneumonia due to coronavirus disease 2019: Secondary | ICD-10-CM | POA: Diagnosis present

## 2020-03-25 LAB — TRIGLYCERIDES: Triglycerides: 115 mg/dL (ref <150)

## 2020-03-25 LAB — BASIC METABOLIC PANEL
Anion gap: 12 (ref 5–15)
BUN: 13 mg/dL (ref 6–20)
CO2: 25 mmol/L (ref 22–32)
Calcium: 9.2 mg/dL (ref 8.9–10.3)
Chloride: 101 mmol/L (ref 98–111)
Creatinine, Ser: 0.97 mg/dL (ref 0.61–1.24)
GFR calc Af Amer: >60 mL/min (ref >60)
GFR calc non Af Amer: >60 mL/min (ref >60)
Glucose, Bld: 101 mg/dL — ABNORMAL HIGH (ref 70–99)
Potassium: 3.8 mmol/L (ref 3.5–5.1)
Sodium: 138 mmol/L (ref 135–145)

## 2020-03-25 LAB — SARS CORONAVIRUS 2 BY RT PCR (HOSPITAL ORDER, PERFORMED IN ~~LOC~~ HOSPITAL LAB): SARS Coronavirus 2: POSITIVE — AB

## 2020-03-25 LAB — CBC
HCT: 47.7 % (ref 39.0–52.0)
Hemoglobin: 16 g/dL (ref 13.0–17.0)
MCH: 31.4 pg (ref 26.0–34.0)
MCHC: 33.5 g/dL (ref 30.0–36.0)
MCV: 93.7 fL (ref 80.0–100.0)
Platelets: 256 10*3/uL (ref 150–400)
RBC: 5.09 MIL/uL (ref 4.22–5.81)
RDW: 12.4 % (ref 11.5–15.5)
WBC: 8 10*3/uL (ref 4.0–10.5)
nRBC: 0 % (ref 0.0–0.2)

## 2020-03-25 LAB — TROPONIN I (HIGH SENSITIVITY)
Troponin I (High Sensitivity): 5 ng/L (ref <18)
Troponin I (High Sensitivity): 7 ng/L (ref <18)

## 2020-03-25 LAB — LACTIC ACID, PLASMA: Lactic Acid, Venous: 1.3 mmol/L (ref 0.5–1.9)

## 2020-03-25 LAB — LACTATE DEHYDROGENASE: LDH: 325 U/L — ABNORMAL HIGH (ref 98–192)

## 2020-03-25 LAB — PROCALCITONIN: Procalcitonin: 0.12 ng/mL

## 2020-03-25 LAB — D-DIMER, QUANTITATIVE: D-Dimer, Quant: 2.46 ug/mL-FEU — ABNORMAL HIGH (ref 0.00–0.50)

## 2020-03-25 LAB — FERRITIN: Ferritin: 2024 ng/mL — ABNORMAL HIGH (ref 24–336)

## 2020-03-25 LAB — C-REACTIVE PROTEIN: CRP: 31.4 mg/dL — ABNORMAL HIGH (ref <1.0)

## 2020-03-25 MED ORDER — DEXAMETHASONE 6 MG PO TABS: 6.0000 mg | ORAL_TABLET | Freq: Every day | ORAL | Status: DC

## 2020-03-25 MED ORDER — ONDANSETRON HCL 4 MG/2ML IJ SOLN: 4.0000 mg | Freq: Four times a day (QID) | INTRAMUSCULAR | Status: DC | PRN

## 2020-03-25 MED ORDER — IBUPROFEN 800 MG PO TABS
800.0000 mg | ORAL_TABLET | Freq: Once | ORAL | Status: AC
  Administered 2020-03-25: 800 mg via ORAL
  Filled 2020-03-25: qty 1

## 2020-03-25 MED ORDER — ONDANSETRON HCL 4 MG PO TABS: 4.0000 mg | ORAL_TABLET | Freq: Four times a day (QID) | ORAL | Status: DC | PRN

## 2020-03-25 MED ORDER — ACETAMINOPHEN 650 MG RE SUPP: 650.0000 mg | Freq: Four times a day (QID) | RECTAL | Status: DC | PRN

## 2020-03-25 MED ORDER — SODIUM CHLORIDE 0.9% FLUSH
3.0000 mL | Freq: Once | INTRAVENOUS | Status: AC
  Administered 2020-03-25: 3 mL via INTRAVENOUS

## 2020-03-25 MED ORDER — SODIUM CHLORIDE 0.9% FLUSH
3.0000 mL | Freq: Two times a day (BID) | INTRAVENOUS | Status: DC
  Administered 2020-03-26 – 2020-03-30 (×9): 3 mL via INTRAVENOUS

## 2020-03-25 MED ORDER — DEXAMETHASONE SODIUM PHOSPHATE 10 MG/ML IJ SOLN
10.0000 mg | Freq: Once | INTRAMUSCULAR | Status: AC
  Administered 2020-03-25: 10 mg via INTRAVENOUS
  Filled 2020-03-25: qty 1

## 2020-03-25 MED ORDER — SODIUM CHLORIDE 0.9 % IV SOLN
100.0000 mg | Freq: Every day | INTRAVENOUS | Status: AC
  Administered 2020-03-27 – 2020-03-30 (×4): 100 mg via INTRAVENOUS
  Filled 2020-03-25 (×4): qty 20

## 2020-03-25 MED ORDER — ALBUTEROL SULFATE HFA 108 (90 BASE) MCG/ACT IN AERS: 1.0000 | INHALATION_SPRAY | RESPIRATORY_TRACT | Status: DC | PRN

## 2020-03-25 MED ORDER — SODIUM CHLORIDE 0.9 % IV SOLN
200.0000 mg | Freq: Once | INTRAVENOUS | Status: AC
  Administered 2020-03-26: 200 mg via INTRAVENOUS
  Filled 2020-03-25: qty 40

## 2020-03-25 MED ORDER — ENOXAPARIN SODIUM 40 MG/0.4ML ~~LOC~~ SOLN
40.0000 mg | SUBCUTANEOUS | Status: DC
  Administered 2020-03-26 – 2020-03-30 (×5): 40 mg via SUBCUTANEOUS
  Filled 2020-03-25 (×5): qty 0.4

## 2020-03-25 MED ORDER — ACETAMINOPHEN 325 MG PO TABS
650.0000 mg | ORAL_TABLET | Freq: Four times a day (QID) | ORAL | Status: DC | PRN
  Administered 2020-03-26 – 2020-03-30 (×10): 650 mg via ORAL
  Filled 2020-03-25 (×10): qty 2

## 2020-03-25 NOTE — ED Notes (Signed)
Admitting Provider at Bedside.  

## 2020-03-25 NOTE — ED Provider Notes (Signed)
[facility] EMERGENCY DEPARTMENT Provider Note   CSN: [id number] Arrival date & time: 03/25/20  1335     History Chief Complaint  Patient presents with  . Chest Pain    Matthew Mack is a 54 y.o. male hx of diverticulitis, HL, here presenting with shortness of breath and fever.  Patient had a possible exposure to Covid about a week ago.  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  He states that everybody got tested and when he went to the testing center, he was noted to have a pulse ox that was 85%.  He went to [facility] earlier and had a negative antigen test.  Since his oxygen was so low, he was sent to the ER for further evaluation.  Patient states that he has shortness of breath for the last 4 days.  He also has some subjective chills as well. Denies any lung problems and is not on oxygen at home.  He denies a history of asthma  The history is provided by the patient.       Past Medical History:  Diagnosis Date  . Diverticulitis   . Hyperlipidemia     Patient Active Problem List   Diagnosis Date Noted  . Sigmoid diverticulitis 01/17/2020  . Perforated diverticulum of large intestine 06/24/2019    Past Surgical History:  Procedure Laterality Date  . HERNIA REPAIR    . LAPAROSCOPIC SIGMOID COLECTOMY N/A 01/17/2020   Procedure: LAPAROSCOPIC ASSISTED SIGMOID COLECTOMY;  Surgeon: Griselda Miner, MD;  Location: [facility] OR;  Service: General;  Laterality: N/A;       Family History  Problem Relation Age of Onset  . Diabetes Mother     Social History   Tobacco Use  . Smoking status: Never Smoker  . Smokeless tobacco: Never Used  Vaping Use  . Vaping Use: Never used  Substance Use Topics  . Alcohol use: Not Currently  . Drug use: Never    Home Medications Prior to Admission medications   Medication Sig Start Date End Date Taking? Authorizing Provider  HYDROcodone-acetaminophen (NORCO/VICODIN) 5-325 MG tablet Take 1-2 tablets by mouth  every 6 (six) hours as needed for moderate pain. 01/20/20   Chevis Pretty III, MD  ibuprofen (ADVIL) 200 MG tablet Take 400 mg by mouth every 6 (six) hours as needed for moderate pain.    [provider]  oxyCODONE (OXY IR/ROXICODONE) 5 MG immediate release tablet Take 1 tablet (5 mg total) by mouth every 6 (six) hours as needed for moderate pain. Patient not taking: Reported on 01/02/2020 06/29/19   Jerre Simon, PA    Allergies    Penicillins and Sulfa antibiotics  Review of Systems   Review of Systems  Respiratory: Positive for shortness of breath.   Cardiovascular: Positive for chest pain.  All other systems reviewed and are negative.   Physical Exam Updated Vital Signs BP (!) 159/102   Pulse (!) 110   Temp 99.6 F (37.6 C) (Oral)   Resp (!) 22   Ht 5\' 10"  (1.778 m)   Wt 88.5 kg   SpO2 93%   BMI 27.98 kg/m   Physical Exam Vitals and nursing note reviewed.  HENT:     Head: Normocephalic.  Eyes:     Pupils: Pupils are equal, round, and reactive to light.  Cardiovascular:     Rate and Rhythm: Normal rate and regular rhythm.     Heart sounds: Normal heart sounds.  Pulmonary:     Comments: Tachypneic, crackles bilateral bases  Abdominal:     General: Bowel sounds are normal.     Palpations: Abdomen is soft.  Musculoskeletal:        General: Normal range of motion.     Cervical back: Normal range of motion.  Skin:    General: Skin is warm.     Capillary Refill: Capillary refill takes less than 2 seconds.  Neurological:     General: No focal deficit present.     Mental Status: He is alert and oriented to person, place, and time.  Psychiatric:        Mood and Affect: Mood normal.        Behavior: Behavior normal.     ED Results / Procedures / Treatments   Labs (all labs ordered are listed, but only abnormal results are displayed) Labs Reviewed  BASIC METABOLIC PANEL - Abnormal; Notable for the following components:      Result Value   Glucose, Bld  101 (*)    All other components within normal limits  CULTURE, BLOOD (ROUTINE X 2)  CULTURE, BLOOD (ROUTINE X 2)  SARS CORONAVIRUS 2 BY RT PCR (HOSPITAL ORDER, [place] [facility])  CBC  LACTIC ACID, PLASMA  LACTIC ACID, PLASMA  LACTATE DEHYDROGENASE  FERRITIN  C-REACTIVE PROTEIN  TRIGLYCERIDES  PROCALCITONIN  D-DIMER, QUANTITATIVE (NOT AT [facility])  TROPONIN I (HIGH SENSITIVITY)  TROPONIN I (HIGH SENSITIVITY)    EKG None  Radiology DG Chest 2 View  Result Date: 03/25/2020 CLINICAL DATA:  Shortness of breath for several days EXAM: CHEST - 2 VIEW COMPARISON:  10/13/2009 FINDINGS: Cardiac shadow is stable. The lungs are well aerated bilaterally. Patchy bilateral airspace opacities are noted particularly in the lower lobe on the left. No sizable effusion is noted. No bony abnormality is seen. IMPRESSION: Patchy multifocal airspace opacities consistent with atypical pneumonia. Electronically Signed   By: Inez Catalina M.D.   On: 03/25/2020 14:29    Procedures Procedures (including critical care time)  CRITICAL CARE Performed by: Wandra Arthurs   Total critical care time: 30 minutes  Critical care time was exclusive of separately billable procedures and treating other patients.  Critical care was necessary to treat or prevent imminent or life-threatening deterioration.  Critical care was time spent personally by me on the following activities: development of treatment plan with patient and/or surrogate as well as nursing, discussions with consultants, evaluation of patient's response to treatment, examination of patient, obtaining history from patient or surrogate, ordering and performing treatments and interventions, ordering and review of laboratory studies, ordering and review of radiographic studies, pulse oximetry and re-evaluation of patient's condition.   Medications Ordered in ED Medications  sodium chloride flush (NS) 0.9 % injection 3 mL (3 mLs Intravenous  Given 03/25/20 1901)  ibuprofen (ADVIL) tablet 800 mg (800 mg Oral Given 03/25/20 1928)    ED Course  I have reviewed the triage vital signs and the nursing notes.  Pertinent labs & imaging results that were available during my care of the patient were reviewed by me and considered in my medical decision making (see chart for details).    MDM Rules/Calculators/A&P                          Matthew Mack is a 54 y.o. male here presenting with shortness of breath and hypoxia and fever.  High suspicion for Covid.  He had a  possible Covid exposure at work as well.  Will get Covid preadmission labs.  He had antigen test that was negative this morning so we will get a PCR test.  8:36 PM COVID positive. Inflammatory markers elevated. CXR showed viral pneumonia consistent with COVID. Given steroids. Will admit for hypoxia from COVID.   Matthew Mack was evaluated in Emergency Department on 03/25/2020 for the symptoms described in the history of present illness. He was evaluated in the context of the global COVID-19 pandemic, which necessitated consideration that the patient might be at risk for infection with the SARS-CoV-2 virus that causes COVID-19. Institutional protocols and algorithms that pertain to the evaluation of patients at risk for COVID-19 are in a state of rapid change based on information released by regulatory bodies including the CDC and federal and state organizations. These policies and algorithms were followed during the patient's care in the ED.  Final Clinical Impression(s) / ED Diagnoses Final diagnoses:  None    Rx / DC Orders ED Discharge Orders    None       Charlynne Pander, MD 03/25/20 2037

## 2020-03-25 NOTE — H&P (Signed)
History and Physical    PLEASE NOTE THAT DRAGON DICTATION SOFTWARE WAS USED IN THE CONSTRUCTION OF THIS NOTE.   Matthew Mack JSE:[id number] DOB: [birth year] DOA: 03/25/2020  PCP: Caren Macadam, MD Patient coming from: home   I have personally briefly reviewed patient's old medical records in [place]  Chief Complaint: Shortness of breath  HPI: Matthew Mack is a 54 y.o. male with medical history significant for diverticulitis, perforated diverticulum of large intestine status post sigmoid colectomy in April 2021, who is admitted to [facility] on 03/25/2020 with severe COVID-19 pneumonia after presenting from home to [facility] Emergency Department complaining of shortness of breath.   The patient reports 4 days of progressive shortness of breath associated with new onset nonproductive cough as well as subjective fever.  Denies any associated chest pain, palpitations, diaphoresis, palpitations.  Denies any associated chills, rigors, or generalized myalgias.  Denies any recent headache, neck stiffness, rhinitis, rhinorrhea, sore throat, nausea, vomiting, abdominal pain, diarrhea, or rash.  Denies any recent traveling, but the patient conveys his belief that he was exposed to a COVID-19 positive individual work approximately 7 to 10 days ago. Denies dysuria, gross hematuria, or change in urinary urgency/frequency.  Denies any recent peripheral edema, lower extremity erythema, or calf tenderness.  Denies any recent hemoptysis or trauma.  No personal or family history of DVT or PE.  Of note, the patient underwent sigmoid colectomy during the first week of April 2021 in the setting of perforated diverticulum.   He reports that he is a lifelong non-smoker, denies any known underlying history of hypertension, diabetes, or heart failure.  Denies any known underlying baseline supplemental oxygen requirements.     ED Course:  Vital signs in the ED were notable for the following:  Temperature max of 102.1; heart rate 86-94; blood pressure 122/80 3-1 30/82; respiratory rate 23-31; initial oxygen saturation in the 87% on room air, which subsequent improved to 94 to 95% on 2 L nasal cannula  Labs were notable for the following: BMP notable for sodium 130, bicarbonate 25, creatinine 0.97.  General inflammatory markers were notable for the following: LDH 325, ferritin 2024, CRP 31.4, D-dimer 2.46, procalcitonin 0.12, and triglycerides 115.  CBC notable for white blood cell count of 8000 with hemoglobin 18.  Lactic acid 1.3.  Nasopharyngeal COVID-19 PCR performed in the ED sinusotomy positive.  Blood cultures x2 were collected.  Presenting EKG showed normal sinus rhythm with heart rate 100 and no evidence of acute ischemic changes.  Chest x-ray showed patchy multifocal airspace opacities consistent with atypical pneumonia the absence of any associated edema, effusion, or pneumothorax.  While in the ED, the following were administered: Decadron 10 mg IV x1.    Review of Systems: As per HPI otherwise 10 point review of systems negative.   Past Medical History:  Diagnosis Date  . Diverticulitis   . Hyperlipidemia     Past Surgical History:  Procedure Laterality Date  . HERNIA REPAIR    . LAPAROSCOPIC SIGMOID COLECTOMY N/A 01/17/2020   Procedure: LAPAROSCOPIC ASSISTED SIGMOID COLECTOMY;  Surgeon: Jovita Kussmaul, MD;  Location: [place];  Service: General;  Laterality: N/A;    Social History:  reports that he has never smoked. He has never used smokeless tobacco. He reports previous alcohol use. He reports that he does not use drugs.   Allergies  Allergen Reactions  . Penicillins Hives  . Sulfa Antibiotics Itching    Family History  Problem Relation Age  of Onset  . Diabetes Mother      Prior to Admission medications   Medication Sig Start Date End Date Taking? Authorizing Provider  Cyanocobalamin (VITAMIN B-12 PO) Take 1 tablet by mouth daily.   Yes [provider]  ibuprofen (ADVIL) 200 MG tablet Take 800 mg by mouth every 6 (six) hours as needed for fever, headache or moderate pain.    Yes [provider]  Multiple Vitamin (MULTIVITAMIN ADULT PO) Take 1 tablet by mouth daily.   Yes [provider]  Omega-3 Fatty Acids (FISH OIL PO) Take 1 capsule by mouth daily.   Yes [provider]  Thiamine HCl (VITAMIN B-1 PO) Take 1 tablet by mouth daily.   Yes [provider]  HYDROcodone-acetaminophen (NORCO/VICODIN) 5-325 MG tablet Take 1-2 tablets by mouth every 6 (six) hours as needed for moderate pain. Patient not taking: Reported on 03/25/2020 01/20/20   Chevis Pretty III, MD  oxyCODONE (OXY IR/ROXICODONE) 5 MG immediate release tablet Take 1 tablet (5 mg total) by mouth every 6 (six) hours as needed for moderate pain. Patient not taking: Reported on 01/02/2020 06/29/19   Jerre Simon, [place]     Objective    Physical Exam: Vitals:   03/25/20 1817 03/25/20 1928 03/25/20 2015 03/25/20 2109  BP: (!) 159/102  130/88 (!) 132/92  Pulse: (!) 110 (!) 110 (!) 107 96  Resp: (!) 24 (!) 22 (!) 28 (!) 30  Temp: 99.8 F (37.7 C) 99.6 F (37.6 C)    TempSrc:  Oral    SpO2: (!) 85% 93% 95% 95%  Weight:      Height:        General: appears to be stated age; alert, oriented; mild increased work of breathing noted. Skin: warm, dry, no rash Head:  AT/[place] Eyes:  PEARL b/l, EOMI Mouth:  Oral mucosa membranes appear moist, normal dentition Neck: supple; trachea midline Heart:  RRR; did not appreciate any M/R/G Lungs: CTAB, did not appreciate any wheezes, rales, or rhonchi Abdomen: + BS; soft, ND, NT Vascular: 2+ pedal pulses b/l; 2+ radial pulses b/l Extremities: no peripheral edema, no muscle wasting Neuro: strength and sensation intact in upper and lower extremities b/l    Labs on Admission: I have personally reviewed following labs and imaging studies  CBC: Recent Labs  Lab 03/25/20 1356  WBC 8.0  HGB  16.0  HCT 47.7  MCV 93.7  PLT 256   Basic Metabolic Panel: Recent Labs  Lab 03/25/20 1356  NA 138  K 3.8  CL 101  CO2 25  GLUCOSE 101*  BUN 13  CREATININE 0.97  CALCIUM 9.2   GFR: Estimated Creatinine Clearance: 99.8 mL/min (by C-G formula based on SCr of 0.97 mg/dL). Liver Function Tests: No results for input(s): AST, ALT, ALKPHOS, BILITOT, PROT, ALBUMIN in the last 168 hours. No results for input(s): LIPASE, AMYLASE in the last 168 hours. No results for input(s): AMMONIA in the last 168 hours. Coagulation Profile: No results for input(s): INR, PROTIME in the last 168 hours. Cardiac Enzymes: No results for input(s): CKTOTAL, CKMB, CKMBINDEX, TROPONINI in the last 168 hours. BNP (last 3 results) No results for input(s): PROBNP in the last 8760 hours. HbA1C: No results for input(s): HGBA1C in the last 72 hours. CBG: No results for input(s): GLUCAP in the last 168 hours. Lipid Profile: Recent Labs    03/25/20 1855  TRIG 115   Thyroid Function Tests: No results for input(s): TSH, T4TOTAL, FREET4, T3FREE, THYROIDAB  in the last 72 hours. Anemia Panel: Recent Labs    03/25/20 1855  FERRITIN 2,024*   Urine analysis:    Component Value Date/Time   COLORURINE YELLOW 06/23/2019 2051   APPEARANCEUR HAZY (A) 06/23/2019 2051   LABSPEC 1.020 06/23/2019 2051   PHURINE 5.0 06/23/2019 2051   GLUCOSEU NEGATIVE 06/23/2019 2051   HGBUR NEGATIVE 06/23/2019 2051   BILIRUBINUR NEGATIVE 06/23/2019 2051   KETONESUR 80 (A) 06/23/2019 2051   PROTEINUR 30 (A) 06/23/2019 2051   NITRITE NEGATIVE 06/23/2019 2051   LEUKOCYTESUR TRACE (A) 06/23/2019 2051    Radiological Exams on Admission: DG Chest 2 View  Result Date: 03/25/2020 CLINICAL DATA:  Shortness of breath for several days EXAM: CHEST - 2 VIEW COMPARISON:  10/13/2009 FINDINGS: Cardiac shadow is stable. The lungs are well aerated bilaterally. Patchy bilateral airspace opacities are noted particularly in the lower lobe on the  left. No sizable effusion is noted. No bony abnormality is seen. IMPRESSION: Patchy multifocal airspace opacities consistent with atypical pneumonia. Electronically Signed   By: Inez Catalina M.D.   On: 03/25/2020 14:29     EKG: Independently reviewed, with result as described above.    Assessment/Plan   Victorhugo Preis is a 54 y.o. 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    Principal Problem:   Pneumonia due to COVID-19 virus Active Problems:   SOB (shortness of breath)   Severe sepsis (HCC)   Hyperlipidemia   Fever    #) Severe COVID-19 pneumonia: Diagnosis on the basis of 4 days of progressive shortness of breath associate with new onset nonproductive cough and subjective fever in the context of a preceding COVID-19 positive exposure at work, and associated with tachypnea, acute hypoxic respiratory distress, elevated generalized inflammatory markers, and chest x-ray showing atypical findings, as above. In setting of acute hypoxia, criteria are met from patient's COVID-19 infection to be considered severe in nature. Consequently, there is a Grade 2c rec for dexamethasone, which is further supported by treatment guidance recommendations from [place].  Additionally, criteria met for initiation of remdesivir on the basis of symptomatic COVID-19 infection requiring hospitalization per treatment guidance recommendations from [facility]'s Covid Treatment.  We will add on liver enzymes to assess current ALT level in anticipation of subsequent initiation of remdesivir.  Do not appear to assess any of the form of disease associated with increased risk for severe disease course,  including no history of hypertension, diabetes, obesity, or GERD.  No known chronic underlying pulmonary conditions, and the patient obesity is a lifelong non-smoker. Of note, non-elevated presenting procalcitonin level renders bacterial pna to be less likely given associated high negative predictive value of this finding in the context of confirmed Covid pneumonia.   Plan: Airborne and contact precautions with abduction.  Monitor continuous pulse oximetry.  As needed supplemental oxygen ordered maintain oxygen saturation greater than equal to 94%. Proning protocol initiated. monitor on telemetry. PRN albuterol inhaler. PRN acetaminophen for fever. Start dexamethasone. Add-on liver enzymes, as above. Pharmacy consult for remdesivir placed, as above. Can consider Tocilizumab if worsening hypoxemia and CRP > 10, with independent indication if patient subsequently requires intubation. Repeat inflammatory markers (fibrinogen, d dimer, crp, ferritin, LDH) in the morning. Will follow trend of d-dimer, which,  if greater than 5 or if pt becomes critically ill, will warrant escalation of pharmacologic DVT prophylaxis from low-dose to intermediate-dose. Check serum magnesium and phosphorus levels. Check CMP and CBC in the morning. Check ABG for the purpose of evaluating PaO2 to FiO2 ratio.      #) Severe sepsis: Appears to be on the basis presenting severe COVID-19 pneumonia, as further described above.  SIRS criteria met via presenting objective fever as well as tachypnea.  Criteria are met for patient sepsis to be considered severe nature on the basis of concomitant evidence of endorgan damage in the form of presenting acute hypoxic respiratory distress.  Of note, presenting lactic acid to be nonelevated at 1.3.  No clinical evidence to suggest concomitant underlying bacterial infectious process at this time.  Therefore, we will refrain from introduction of antibiotics at this time. Will check urinalysis to further  evaluate this possibility.  The absence of hypotension or an elevated lactic acid level greater than 4.0, there is no current indication for initiation of a 30 mL/kg IV fluid bolus at this time.  Plan: Monitor for results blood cultures x2 collected in the ED this evening.  Repeat CBC with differential in the morning.  Evaluation management of presenting severe COVID-19 negative as further described above.  Check urinalysis.  As needed acetaminophen for fever.  Monitor on continuous pulse oximetry.      #) Acute hypoxic respiratory distress: In the context of no baseline supplemental oxygen requirements, initial oxygen saturation upon presentation to the emergency department this evening was noted to be 87% on room air, which subsequently improved to 94 to 95% 2 L nasal cannula.  This appears to be as a result of presenting severe COVID-19 pneumonia, as prescribed above.  Clinically, presentation is less suggestive of acute pulmonary embolism or acutely elevated heart failure.  ACS is also felt to be less likely in the absence of any chest discomfort, while presenting EKG shows no evidence of acute ischemic changes.  Presenting chest x-ray shows no evidence of pneumothorax.   Plan: Recommend management of presenting severe COVID-19 pneumonia, as prescribed above.  Check serum phosphorus level.  Check ABG as a component of evaluation and management of presenting COVID-19 pneumonia.  Monitor on continuous pulse oximetry.  Monitor on telemetry.     #) History of hyperlipidemia: Documented history of such, although it does not appear that current outpatient medication list includes any statin or other antilipid medication.  Plan: We will attempt to reconcile the absence of antilipid medication from the patient's home medication list.    DVT prophylaxis: Lovenox 40 mg subcu daily Code Status: Full code Family Communication: none Disposition Plan: Per Rounding Team Consults called: none    Admission status: Inpatient; med telemetry.    PLEASE NOTE THAT DRAGON DICTATION SOFTWARE WAS USED IN THE CONSTRUCTION OF THIS NOTE.   Rhetta Mura DO [facility] Pager [phone] From [place]   03/25/2020, 9:16 PM

## 2020-03-25 NOTE — ED Triage Notes (Signed)
Patient arrives to ED with complaints of chest tightness, shortness of breath, headache, and decreased appetite for the last four. Patient states that he went for a COVID test, swabbed negative but his O2 levels were 88%. Patient states he just feels fatigued lately.

## 2020-03-26 DIAGNOSIS — R0902 Hypoxemia: Secondary | ICD-10-CM

## 2020-03-26 LAB — URINALYSIS, ROUTINE W REFLEX MICROSCOPIC
Bilirubin Urine: NEGATIVE
Glucose, UA: NEGATIVE mg/dL
Hgb urine dipstick: NEGATIVE
Ketones, ur: 20 mg/dL — AB
Leukocytes,Ua: NEGATIVE
Nitrite: NEGATIVE
Protein, ur: 30 mg/dL — AB
Specific Gravity, Urine: 1.032 — ABNORMAL HIGH (ref 1.005–1.030)
pH: 5 (ref 5.0–8.0)

## 2020-03-26 LAB — BLOOD GAS, ARTERIAL
Acid-Base Excess: 1 mmol/L (ref 0.0–2.0)
Bicarbonate: 24.5 mmol/L (ref 20.0–28.0)
Drawn by: 31101
FIO2: 28
O2 Saturation: 99.2 %
Patient temperature: 36.8
pCO2 arterial: 34.9 mmHg (ref 32.0–48.0)
pH, Arterial: 7.46 — ABNORMAL HIGH (ref 7.350–7.450)
pO2, Arterial: 179 mmHg — ABNORMAL HIGH (ref 83.0–108.0)

## 2020-03-26 LAB — FIBRINOGEN: Fibrinogen: >800 mg/dL — ABNORMAL HIGH (ref 210–475)

## 2020-03-26 LAB — CBC WITH DIFFERENTIAL/PLATELET
Abs Immature Granulocytes: 0.07 10*3/uL (ref 0.00–0.07)
Basophils Absolute: 0.1 10*3/uL (ref 0.0–0.1)
Basophils Relative: 1 %
Eosinophils Absolute: 0 10*3/uL (ref 0.0–0.5)
Eosinophils Relative: 0 %
HCT: 46.6 % (ref 39.0–52.0)
Hemoglobin: 15.7 g/dL (ref 13.0–17.0)
Immature Granulocytes: 1 %
Lymphocytes Relative: 6 %
Lymphs Abs: 0.5 10*3/uL — ABNORMAL LOW (ref 0.7–4.0)
MCH: 31.8 pg (ref 26.0–34.0)
MCHC: 33.7 g/dL (ref 30.0–36.0)
MCV: 94.5 fL (ref 80.0–100.0)
Monocytes Absolute: 0.3 10*3/uL (ref 0.1–1.0)
Monocytes Relative: 3 %
Neutro Abs: 7.4 10*3/uL (ref 1.7–7.7)
Neutrophils Relative %: 89 %
Platelets: 301 10*3/uL (ref 150–400)
RBC: 4.93 MIL/uL (ref 4.22–5.81)
RDW: 12.7 % (ref 11.5–15.5)
WBC: 8.3 10*3/uL (ref 4.0–10.5)
nRBC: 0 % (ref 0.0–0.2)

## 2020-03-26 LAB — COMPREHENSIVE METABOLIC PANEL
ALT: 73 U/L — ABNORMAL HIGH (ref 0–44)
AST: 66 U/L — ABNORMAL HIGH (ref 15–41)
Albumin: 2.6 g/dL — ABNORMAL LOW (ref 3.5–5.0)
Alkaline Phosphatase: 131 U/L — ABNORMAL HIGH (ref 38–126)
Anion gap: 16 — ABNORMAL HIGH (ref 5–15)
BUN: 19 mg/dL (ref 6–20)
CO2: 21 mmol/L — ABNORMAL LOW (ref 22–32)
Calcium: 9.1 mg/dL (ref 8.9–10.3)
Chloride: 101 mmol/L (ref 98–111)
Creatinine, Ser: 0.99 mg/dL (ref 0.61–1.24)
GFR calc Af Amer: >60 mL/min (ref >60)
GFR calc non Af Amer: >60 mL/min (ref >60)
Glucose, Bld: 134 mg/dL — ABNORMAL HIGH (ref 70–99)
Potassium: 4.7 mmol/L (ref 3.5–5.1)
Sodium: 138 mmol/L (ref 135–145)
Total Bilirubin: 0.8 mg/dL (ref 0.3–1.2)
Total Protein: 6 g/dL — ABNORMAL LOW (ref 6.5–8.1)

## 2020-03-26 LAB — HEPATIC FUNCTION PANEL
ALT: 71 U/L — ABNORMAL HIGH (ref 0–44)
AST: 62 U/L — ABNORMAL HIGH (ref 15–41)
Albumin: 2.5 g/dL — ABNORMAL LOW (ref 3.5–5.0)
Alkaline Phosphatase: 126 U/L (ref 38–126)
Bilirubin, Direct: 0.3 mg/dL — ABNORMAL HIGH (ref 0.0–0.2)
Indirect Bilirubin: 0.4 mg/dL (ref 0.3–0.9)
Total Bilirubin: 0.7 mg/dL (ref 0.3–1.2)
Total Protein: 6.8 g/dL (ref 6.5–8.1)

## 2020-03-26 LAB — PHOSPHORUS: Phosphorus: 4.8 mg/dL — ABNORMAL HIGH (ref 2.5–4.6)

## 2020-03-26 LAB — MAGNESIUM
Magnesium: 2.3 mg/dL (ref 1.7–2.4)
Magnesium: 2.2 mg/dL (ref 1.7–2.4)

## 2020-03-26 LAB — C-REACTIVE PROTEIN: CRP: 32.7 mg/dL — ABNORMAL HIGH (ref <1.0)

## 2020-03-26 LAB — LACTATE DEHYDROGENASE: LDH: 354 U/L — ABNORMAL HIGH (ref 98–192)

## 2020-03-26 LAB — LACTIC ACID, PLASMA: Lactic Acid, Venous: 1.7 mmol/L (ref 0.5–1.9)

## 2020-03-26 LAB — FERRITIN: Ferritin: 2022 ng/mL — ABNORMAL HIGH (ref 24–336)

## 2020-03-26 LAB — D-DIMER, QUANTITATIVE: D-Dimer, Quant: 2.33 ug/mL-FEU — ABNORMAL HIGH (ref 0.00–0.50)

## 2020-03-26 MED ORDER — METHYLPREDNISOLONE SODIUM SUCC 125 MG IJ SOLR
45.0000 mg | Freq: Two times a day (BID) | INTRAMUSCULAR | Status: DC
  Administered 2020-03-26 – 2020-03-27 (×3): 45 mg via INTRAVENOUS
  Filled 2020-03-26 (×3): qty 2

## 2020-03-26 NOTE — Progress Notes (Signed)
   03/26/20 2005  Assess: MEWS Score  Temp 97.9 F (36.6 C)  BP 127/86  Pulse Rate 88  ECG Heart Rate 88  Level of Consciousness Alert  SpO2 (!) 85 %  O2 Device Nasal Cannula  O2 Flow Rate (L/min) 3 L/min  Assess: MEWS Score  MEWS Temp 0  MEWS Systolic 0  MEWS Pulse 0  MEWS RR 2  MEWS LOC 0  MEWS Score 2  MEWS Score Color Yellow  Assess: if the MEWS score is Yellow or Red  Were vital signs taken at a resting state? Yes  Focused Assessment Documented focused assessment  Early Detection of Sepsis Score *See Row Information* Low  MEWS guidelines implemented *See Row Information* No, previously yellow, continue vital signs every 4 hours  Treat  MEWS Interventions Administered scheduled meds/treatments  Take Vital Signs  Increase Vital Sign Frequency  Yellow: Q 2hr X 2 then Q 4hr X 2, if remains yellow, continue Q 4hrs  Document  Progress note created (see row info) Yes  Pt coughing off/on with SOB when coughing. Pt with no other complaints. Will continue to monitor and treat as needed. Margarette Asal, RN

## 2020-03-26 NOTE — Progress Notes (Signed)
Patient admitted to 82w13. Skin assessed. Alert and oriented x 4. Educated & oriented to the floor, room, and call bell.    03/26/20 0103  Vitals  Temp 98.2 F (36.8 C)  Temp Source Oral  BP 133/90  MAP (mmHg) 105  BP Location Left Arm  BP Method Automatic  Patient Position (if appropriate) Sitting  Pulse Rate 80  Pulse Rate Source Monitor  Resp 20  Oxygen Therapy  SpO2 92 %  O2 Device Nasal Cannula  O2 Flow Rate (L/min) 2 L/min  MEWS Score  MEWS Temp 0  MEWS Systolic 0  MEWS Pulse 0  MEWS RR 0  MEWS LOC 0  MEWS Score 0  MEWS Score Color [place]

## 2020-03-26 NOTE — Progress Notes (Signed)
PROGRESS NOTE                                                                                                                                                                                                             Patient Demographics:    Matthew Mack, is a 54 y.o. male, DOB - [birth year], [id number]:[id number]  Outpatient Primary MD for the patient is Aliene Beams, MD   Admit date - 03/25/2020   LOS - 1  Chief Complaint  Patient presents with  . Chest Pain       Brief Narrative: Patient is a 54 y.o. male with PMHx of colectomy-for a perforated diverticulitis in April-presenting with shortness of breath-found to have hypoxia associated with COVID-19 pneumonia.  Significant Events: 6/15>> admit to [facility] for hypoxia of due to COVID-19  COVID-19 medications: Steroids: 6/15>> Remdesivir: 6/15>>  Antibiotics: None  Microbiology data: 6/15: Blood culture>> negative  Procedures: None  Consults: None  DVT prophylaxis: enoxaparin (LOVENOX) injection 40 mg Start: 03/26/20 0900 SCDs Start: 03/25/20 2326   Subjective:    [name] today has no major complaints-lying comfortably in bed.  On 2 L of oxygen.   Assessment  & Plan :   Acute Hypoxic Resp Failure due to Covid 19 Viral pneumonia: Appears stable on 2 L of oxygen-even though CRP significantly elevated continue steroids (will change to IV Solu-Medrol)/remdesivir.  Encourage incentive spirometry/flutter valve-mobilize and see how he does.    Longstanding history of diverticulitis-finally requiring colectomy last month-probably prudent to avoid Actemra.  However hypoxia is mild-and no indication for Actemra currently.  Do not think patient had sepsis physiology present on admission.  Fever: afebrile Prone/Incentive Spirometry: encouraged incentive spirometry use 3-4/hour. O2 requirements:  SpO2: 92 % O2 Flow Rate (L/min): 2 L/min   COVID-19 Labs: Recent  Labs    03/25/20 1855 03/25/20 2008 03/26/20 0219  DDIMER  --  2.46* 2.33*  FERRITIN 2,024*  --  2,022*  LDH 325*  --  354*  CRP 31.4*  --  32.7*    No results found for: BNP  Recent Labs  Lab 03/25/20 1850  PROCALCITON 0.12    Lab Results  Component Value Date   SARSCOV2NAA POSITIVE (A) 03/25/2020   SARSCOV2NAA NEGATIVE 01/14/2020   SARSCOV2NAA NEGATIVE 06/23/2019    Transaminitis: Appears to be mild-likely secondary to COVID-19-watch closely  while on remdesivir.  History of recent sigmoid colectomy for perforated sigmoid diverticulitis on 01/17/2020: Abdominal exam is benign   ABG:    Component Value Date/Time   PHART 7.460 (H) 03/26/2020 0250   PCO2ART 34.9 03/26/2020 0250   PO2ART 179 (H) 03/26/2020 0250   HCO3 24.5 03/26/2020 0250   O2SAT 99.2 03/26/2020 0250    Vent Settings: N/A  Condition - Stable  Family Communication  : Patient prefers to update spouse himself-he will let me know if his spouse has additional questions for me.  Code Status :  Full Code  Diet :  Diet Order            Diet regular Room service appropriate? Yes; Fluid consistency: Thin  Diet effective now                  Disposition Plan  :   Status is: Inpatient  Remains inpatient appropriate because:Inpatient level of care appropriate due to severity of illness   Dispo: The patient is from: Home              Anticipated d/c is to: Home              Anticipated d/c date is: 2 days              Patient currently is not medically stable to d/c.   Barriers to discharge: Hypoxia requiring O2 supplementation/complete 5 days of IV Remdesivir  Antimicorbials  :    Anti-infectives (From admission, onward)   Start     Dose/Rate Route Frequency Ordered Stop   03/27/20 1000  remdesivir 100 mg in sodium chloride 0.9 % 100 mL IVPB     Discontinue    "Followed by" Linked Group Details   100 mg 200 mL/hr over 30 Minutes Intravenous Daily 03/25/20 2340 03/31/20 0959   03/26/20  0000  remdesivir 200 mg in sodium chloride 0.9% 250 mL IVPB       "Followed by" Linked Group Details   200 mg 580 mL/hr over 30 Minutes Intravenous Once 03/25/20 2340 03/26/20 0330      Inpatient Medications  Scheduled Meds: . enoxaparin (LOVENOX) injection  40 mg Subcutaneous Q24H  . methylPREDNISolone (SOLU-MEDROL) injection  45 mg Intravenous Q12H  . sodium chloride flush  3 mL Intravenous Q12H   Continuous Infusions: . [START ON 03/27/2020] remdesivir 100 mg in NS 100 mL     PRN Meds:.acetaminophen **OR** acetaminophen, albuterol, ondansetron **OR** ondansetron (ZOFRAN) IV   Time Spent in minutes  25  See all Orders from today for further details   Jeoffrey Massed M.D on 03/26/2020 at 12:05 PM  To page go to [email or web address] - use universal password  [facility] -  Office  [phone]    Objective:   Vitals:   03/26/20 0103 03/26/20 0400 03/26/20 0828 03/26/20 1200  BP: 133/90 112/82 121/82 (!) 129/95  Pulse: 80 72 68 78  Resp: 20 20 (!) 22 (!) 24  Temp: 98.2 F (36.8 C) 97.8 F (36.6 C) 97.7 F (36.5 C) 98.4 F (36.9 C)  TempSrc: Oral Oral Oral Oral  SpO2: 92% 93% 92% 92%  Weight:  90.9 kg    Height:        Wt Readings from Last 3 Encounters:  03/26/20 90.9 kg  01/20/20 98.2 kg  01/09/20 99.2 kg     Intake/Output Summary (Last 24 hours) at 03/26/2020 1205 Last data filed at 03/26/2020 0300 Gross per 24 hour  Intake 250 ml  Output --  Net 250 ml     Physical Exam Gen Exam:Alert awake-not in any distress HEENT:atraumatic, normocephalic Chest: B/L clear to auscultation anteriorly CVS:S1S2 regular Abdomen:soft non tender, non distended Extremities:no edema Neurology: Non focal Skin: no rash   Data Review:    CBC Recent Labs  Lab 03/25/20 1356 03/26/20 0219  WBC 8.0 8.3  HGB 16.0 15.7  HCT 47.7 46.6  PLT 256 301  MCV 93.7 94.5  MCH 31.4 31.8  MCHC 33.5 33.7  RDW 12.4 12.7  LYMPHSABS  --  0.5*  MONOABS  --  0.3  EOSABS  --   0.0  BASOSABS  --  0.1    Chemistries  Recent Labs  Lab 03/25/20 1356 03/26/20 0219  NA 138 138  K 3.8 4.7  CL 101 101  CO2 25 21*  GLUCOSE 101* 134*  BUN 13 19  CREATININE 0.97 0.99  CALCIUM 9.2 9.1  MG  --  2.2  2.3  AST  --  66*  62*  ALT  --  73*  71*  ALKPHOS  --  131*  126  BILITOT  --  0.8  0.7   ------------------------------------------------------------------------------------------------------------------ Recent Labs    03/25/20 1855  TRIG 115    Lab Results  Component Value Date   HGBA1C 5.3 01/09/2020   ------------------------------------------------------------------------------------------------------------------ No results for input(s): TSH, T4TOTAL, T3FREE, THYROIDAB in the last 72 hours.  Invalid input(s): FREET3 ------------------------------------------------------------------------------------------------------------------ Recent Labs    03/25/20 1855 03/26/20 0219  FERRITIN 2,024* 2,022*    Coagulation profile No results for input(s): INR, PROTIME in the last 168 hours.  Recent Labs    03/25/20 2008 03/26/20 0219  DDIMER 2.46* 2.33*    Cardiac Enzymes No results for input(s): CKMB, TROPONINI, MYOGLOBIN in the last 168 hours.  Invalid input(s): CK ------------------------------------------------------------------------------------------------------------------ No results found for: BNP  Micro Results Recent Results (from the past 240 hour(s))  Blood Culture (routine x 2)     Status: None (Preliminary result)   Collection Time: 03/25/20  7:02 PM   Specimen: BLOOD LEFT HAND  Result Value Ref Range Status   Specimen Description BLOOD LEFT HAND  Final   Special Requests   Final    BOTTLES DRAWN AEROBIC AND ANAEROBIC Blood Culture adequate volume   Culture   Final    NO GROWTH < 24 HOURS Performed at [facility], [address] [place], [place] [address]    Report Status PENDING  Incomplete  Blood Culture  (routine x 2)     Status: None (Preliminary result)   Collection Time: 03/25/20  7:04 PM   Specimen: BLOOD RIGHT HAND  Result Value Ref Range Status   Specimen Description BLOOD RIGHT HAND  Final   Special Requests   Final    BOTTLES DRAWN AEROBIC AND ANAEROBIC Blood Culture adequate volume   Culture   Final    NO GROWTH < 24 HOURS Performed at [facility], [address] [place], [place] [address]    Report Status PENDING  Incomplete  SARS Coronavirus 2 by RT PCR (hospital order, performed in [facility] hospital lab) Nasopharyngeal Nasopharyngeal Swab     Status: Abnormal   Collection Time: 03/25/20  7:04 PM   Specimen: Nasopharyngeal Swab  Result Value Ref Range Status   SARS Coronavirus 2 POSITIVE (A) NEGATIVE Final    Comment: RESULT CALLED TO, READ BACK BY AND VERIFIED WITH: L VENEGAS RN 2028 03/25/20 A BROWNING (NOTE) SARS-CoV-2 target nucleic acids are  DETECTED  SARS-CoV-2 RNA is generally detectable in upper respiratory specimens  during the acute phase of infection.  Positive results are indicative  of the presence of the identified virus, but do not rule out bacterial infection or co-infection with other pathogens not detected by the test.  Clinical correlation with patient history and  other diagnostic information is necessary to determine patient infection status.  The expected result is negative.  Fact Sheet for Patients:   StrictlyIdeas.no   Fact Sheet for Healthcare Providers:   BankingDealers.co.za    This test is not yet approved or cleared by the [place] FDA and  has been authorized for detection and/or diagnosis of SARS-CoV-2 by FDA under an Emergency Use Authorization (EUA).  This EUA will remain in effect (meaning this  test can be used) for the duration of  the COVID-19 declaration under Section 564(b)(1) of the Act, 21 U.S.C. section 360-bbb-3(b)(1), unless the authorization is terminated or  revoked sooner.  Performed at [facility], [place] [address] [place], [place] [address]     Radiology Reports DG Chest 2 View  Result Date: 03/25/2020 CLINICAL DATA:  Shortness of breath for several days EXAM: CHEST - 2 VIEW COMPARISON:  10/13/2009 FINDINGS: Cardiac shadow is stable. The lungs are well aerated bilaterally. Patchy bilateral airspace opacities are noted particularly in the lower lobe on the left. No sizable effusion is noted. No bony abnormality is seen. IMPRESSION: Patchy multifocal airspace opacities consistent with atypical pneumonia. Electronically Signed   By: Inez Catalina M.D.   On: 03/25/2020 14:29

## 2020-03-27 LAB — COMPREHENSIVE METABOLIC PANEL
ALT: 141 U/L — ABNORMAL HIGH (ref 0–44)
AST: 117 U/L — ABNORMAL HIGH (ref 15–41)
Albumin: 2.5 g/dL — ABNORMAL LOW (ref 3.5–5.0)
Alkaline Phosphatase: 125 U/L (ref 38–126)
Anion gap: 9 (ref 5–15)
BUN: 25 mg/dL — ABNORMAL HIGH (ref 6–20)
CO2: 26 mmol/L (ref 22–32)
Calcium: 9.1 mg/dL (ref 8.9–10.3)
Chloride: 103 mmol/L (ref 98–111)
Creatinine, Ser: 1.06 mg/dL (ref 0.61–1.24)
GFR calc Af Amer: >60 mL/min (ref >60)
GFR calc non Af Amer: >60 mL/min (ref >60)
Glucose, Bld: 152 mg/dL — ABNORMAL HIGH (ref 70–99)
Potassium: 4.5 mmol/L (ref 3.5–5.1)
Sodium: 138 mmol/L (ref 135–145)
Total Bilirubin: 0.6 mg/dL (ref 0.3–1.2)
Total Protein: 6.1 g/dL — ABNORMAL LOW (ref 6.5–8.1)

## 2020-03-27 LAB — CBC
HCT: 42.4 % (ref 39.0–52.0)
Hemoglobin: 14.5 g/dL (ref 13.0–17.0)
MCH: 31 pg (ref 26.0–34.0)
MCHC: 34.2 g/dL (ref 30.0–36.0)
MCV: 90.8 fL (ref 80.0–100.0)
Platelets: 388 10*3/uL (ref 150–400)
RBC: 4.67 MIL/uL (ref 4.22–5.81)
RDW: 12.1 % (ref 11.5–15.5)
WBC: 14.8 10*3/uL — ABNORMAL HIGH (ref 4.0–10.5)
nRBC: 0 % (ref 0.0–0.2)

## 2020-03-27 LAB — FERRITIN: Ferritin: 2446 ng/mL — ABNORMAL HIGH (ref 24–336)

## 2020-03-27 LAB — D-DIMER, QUANTITATIVE: D-Dimer, Quant: 1.41 ug/mL-FEU — ABNORMAL HIGH (ref 0.00–0.50)

## 2020-03-27 LAB — C-REACTIVE PROTEIN: CRP: 18.5 mg/dL — ABNORMAL HIGH (ref <1.0)

## 2020-03-27 MED ORDER — HYDROCOD POLST-CPM POLST ER 10-8 MG/5ML PO SUER: 5.0000 mL | Freq: Two times a day (BID) | ORAL | Status: DC | PRN

## 2020-03-27 MED ORDER — BENZONATATE 100 MG PO CAPS
200.0000 mg | ORAL_CAPSULE | Freq: Three times a day (TID) | ORAL | Status: DC
  Administered 2020-03-27 – 2020-03-30 (×8): 200 mg via ORAL
  Filled 2020-03-27 (×8): qty 2

## 2020-03-27 MED ORDER — METHYLPREDNISOLONE SODIUM SUCC 40 MG IJ SOLR
40.0000 mg | Freq: Two times a day (BID) | INTRAMUSCULAR | Status: DC
  Administered 2020-03-27 – 2020-03-30 (×6): 40 mg via INTRAVENOUS
  Filled 2020-03-27 (×6): qty 1

## 2020-03-27 NOTE — Progress Notes (Signed)
PROGRESS NOTE                                                                                                                                                                                                             Patient Demographics:    Matthew Mack, is a 54 y.o. male, DOB - [birth year], [id number]  Outpatient Primary MD for the patient is Aliene Beams, MD   Admit date - 03/25/2020   LOS - 2  Chief Complaint  Patient presents with  . Chest Pain       Brief Narrative: Patient is a 54 y.o. male with PMHx of colectomy-for a perforated diverticulitis in April-presenting with shortness of breath-found to have hypoxia associated with COVID-19 pneumonia.  Significant Events: 6/15>> admit to [facility] for hypoxia of due to COVID-19  COVID-19 medications: Steroids: 6/15>> Remdesivir: 6/15>>  Antibiotics: None  Microbiology data: 6/15: Blood culture>> negative  Procedures: None  Consults: None  DVT prophylaxis: enoxaparin (LOVENOX) injection 40 mg Start: 03/26/20 0900 SCDs Start: 03/25/20 2326   Subjective:   No major issues-he is on 2-2 L of oxygen this morning.   Assessment  & Plan :   Acute Hypoxic Resp Failure due to Covid 19 Viral pneumonia: Essentially unchanged-on 2-3 L of oxygen-CRP trending down-continue steroids/remdesivir.  Encourage spirometry/flutter valve-out of bed to chair-mobilize as much as possible.      Longstanding history of diverticulitis-finally requiring colectomy last month-probably prudent to avoid Actemra.  However hypoxia is mild-and no indication for Actemra currently.  Do not think patient had sepsis physiology present on admission.  Fever: afebrile Prone/Incentive Spirometry: encouraged incentive spirometry use 3-4/hour. O2 requirements:  SpO2: 91 % O2 Flow Rate (L/min): 3 L/min   COVID-19 Labs: Recent Labs    03/25/20 1855 03/25/20 2008 03/26/20 0219  03/27/20 0344  DDIMER  --  2.46* 2.33* 1.41*  FERRITIN 2,024*  --  2,022* 2,446*  LDH 325*  --  354*  --   CRP 31.4*  --  32.7* 18.5*    No results found for: BNP  Recent Labs  Lab 03/25/20 1850  PROCALCITON 0.12    Lab Results  Component Value Date   SARSCOV2NAA POSITIVE (A) 03/25/2020   SARSCOV2NAA NEGATIVE 01/14/2020   SARSCOV2NAA NEGATIVE 06/23/2019    Transaminitis: Appears to be mild-likely secondary to COVID-19-watch closely while on remdesivir.  History of recent sigmoid colectomy for perforated sigmoid diverticulitis on 01/17/2020: Abdominal exam is benign   ABG:    Component Value Date/Time   PHART 7.460 (H) 03/26/2020 0250   PCO2ART 34.9 03/26/2020 0250   PO2ART 179 (H) 03/26/2020 0250   HCO3 24.5 03/26/2020 0250   O2SAT 99.2 03/26/2020 0250    Vent Settings: N/A  Condition - Stable  Family Communication  : left voicemail for patient spouse on 6/17  Code Status :  Full Code  Diet :  Diet Order            Diet regular Room service appropriate? Yes; Fluid consistency: Thin  Diet effective now                  Disposition Plan  :   Status is: Inpatient  Remains inpatient appropriate because:Inpatient level of care appropriate due to severity of illness   Dispo: The patient is from: Home              Anticipated d/c is to: Home              Anticipated d/c date is: 2 days              Patient currently is not medically stable to d/c.   Barriers to discharge: Hypoxia requiring O2 supplementation/complete 5 days of IV Remdesivir  Antimicorbials  :    Anti-infectives (From admission, onward)   Start     Dose/Rate Route Frequency Ordered Stop   03/27/20 1000  remdesivir 100 mg in sodium chloride 0.9 % 100 mL IVPB     Discontinue    "Followed by" Linked Group Details   100 mg 200 mL/hr over 30 Minutes Intravenous Daily 03/25/20 2340 03/31/20 0959   03/26/20 0000  remdesivir 200 mg in sodium chloride 0.9% 250 mL IVPB       "Followed by"  Linked Group Details   200 mg 580 mL/hr over 30 Minutes Intravenous Once 03/25/20 2340 03/26/20 0330      Inpatient Medications  Scheduled Meds: . enoxaparin (LOVENOX) injection  40 mg Subcutaneous Q24H  . methylPREDNISolone (SOLU-MEDROL) injection  45 mg Intravenous Q12H  . sodium chloride flush  3 mL Intravenous Q12H   Continuous Infusions: . remdesivir 100 mg in NS 100 mL 100 mg (03/27/20 1043)   PRN Meds:.acetaminophen **OR** acetaminophen, albuterol, ondansetron **OR** ondansetron (ZOFRAN) IV   Time Spent in minutes  25  See all Orders from today for further details   Jeoffrey Massed M.D on 03/27/2020 at 2:23 PM  To page go to [email or web address] - use universal password  [facility] -  Office  [phone]    Objective:   Vitals:   03/27/20 0400 03/27/20 0658 03/27/20 0740 03/27/20 1159  BP: 126/86  123/85 131/90  Pulse: 65 69 69 64  Resp: 20  16 18   Temp: 98 F (36.7 C)  97.9 F (36.6 C) 97.7 F (36.5 C)  TempSrc: Oral  Oral Oral  SpO2: 93% 93% 93% 91%  Weight: 89.7 kg     Height:        Wt Readings from Last 3 Encounters:  03/27/20 89.7 kg  01/20/20 98.2 kg  01/09/20 99.2 kg     Intake/Output Summary (Last 24 hours) at 03/27/2020 1423 Last data filed at 03/27/2020 0900 Gross per 24 hour  Intake 840 ml  Output 1350 ml  Net -510 ml     Physical Exam Gen Exam:Alert awake-not in any distress HEENT:atraumatic, normocephalic  Chest: B/L clear to auscultation anteriorly CVS:S1S2 regular Abdomen:soft non tender, non distended Extremities:no edema Neurology: Non focal Skin: no rash   Data Review:    CBC Recent Labs  Lab 03/25/20 1356 03/26/20 0219 03/27/20 0344  WBC 8.0 8.3 14.8*  HGB 16.0 15.7 14.5  HCT 47.7 46.6 42.4  PLT 256 301 388  MCV 93.7 94.5 90.8  MCH 31.4 31.8 31.0  MCHC 33.5 33.7 34.2  RDW 12.4 12.7 12.1  LYMPHSABS  --  0.5*  --   MONOABS  --  0.3  --   EOSABS  --  0.0  --   BASOSABS  --  0.1  --     Chemistries   Recent Labs  Lab 03/25/20 1356 03/26/20 0219 03/27/20 0344  NA 138 138 138  K 3.8 4.7 4.5  CL 101 101 103  CO2 25 21* 26  GLUCOSE 101* 134* 152*  BUN 13 19 25*  CREATININE 0.97 0.99 1.06  CALCIUM 9.2 9.1 9.1  MG  --  2.2  2.3  --   AST  --  66*  62* 117*  ALT  --  73*  71* 141*  ALKPHOS  --  131*  126 125  BILITOT  --  0.8  0.7 0.6   ------------------------------------------------------------------------------------------------------------------ Recent Labs    03/25/20 1855  TRIG 115    Lab Results  Component Value Date   HGBA1C 5.3 01/09/2020   ------------------------------------------------------------------------------------------------------------------ No results for input(s): TSH, T4TOTAL, T3FREE, THYROIDAB in the last 72 hours.  Invalid input(s): FREET3 ------------------------------------------------------------------------------------------------------------------ Recent Labs    03/26/20 0219 03/27/20 0344  FERRITIN 2,022* 2,446*    Coagulation profile No results for input(s): INR, PROTIME in the last 168 hours.  Recent Labs    03/26/20 0219 03/27/20 0344  DDIMER 2.33* 1.41*    Cardiac Enzymes No results for input(s): CKMB, TROPONINI, MYOGLOBIN in the last 168 hours.  Invalid input(s): CK ------------------------------------------------------------------------------------------------------------------ No results found for: BNP  Micro Results Recent Results (from the past 240 hour(s))  Blood Culture (routine x 2)     Status: None (Preliminary result)   Collection Time: 03/25/20  7:02 PM   Specimen: BLOOD LEFT HAND  Result Value Ref Range Status   Specimen Description BLOOD LEFT HAND  Final   Special Requests   Final    BOTTLES DRAWN AEROBIC AND ANAEROBIC Blood Culture adequate volume   Culture   Final    NO GROWTH 2 DAYS Performed at [facility], [address] [place], [place] [address]    Report Status PENDING   Incomplete  Blood Culture (routine x 2)     Status: None (Preliminary result)   Collection Time: 03/25/20  7:04 PM   Specimen: BLOOD RIGHT HAND  Result Value Ref Range Status   Specimen Description BLOOD RIGHT HAND  Final   Special Requests   Final    BOTTLES DRAWN AEROBIC AND ANAEROBIC Blood Culture adequate volume   Culture   Final    NO GROWTH 2 DAYS Performed at [facility], [address], [place], [place] [address]    Report Status PENDING  Incomplete  SARS Coronavirus 2 by RT PCR (hospital order, performed in [facility] hospital lab) Nasopharyngeal Nasopharyngeal Swab     Status: Abnormal   Collection Time: 03/25/20  7:04 PM   Specimen: Nasopharyngeal Swab  Result Value Ref Range Status   SARS Coronavirus 2 POSITIVE (A) NEGATIVE Final    Comment: RESULT CALLED TO, READ BACK  BY AND VERIFIED WITH: L VENEGAS RN 2028 03/25/20 A BROWNING (NOTE) SARS-CoV-2 target nucleic acids are DETECTED  SARS-CoV-2 RNA is generally detectable in upper respiratory specimens  during the acute phase of infection.  Positive results are indicative  of the presence of the identified virus, but do not rule out bacterial infection or co-infection with other pathogens not detected by the test.  Clinical correlation with patient history and  other diagnostic information is necessary to determine patient infection status.  The expected result is negative.  Fact Sheet for Patients:   StrictlyIdeas.no   Fact Sheet for Healthcare Providers:   BankingDealers.co.za    This test is not yet approved or cleared by the [place] FDA and  has been authorized for detection and/or diagnosis of SARS-CoV-2 by FDA under an Emergency Use Authorization (EUA).  This EUA will remain in effect (meaning this  test can be used) for the duration of  the COVID-19 declaration under Section 564(b)(1) of the Act, 21 U.S.C. section 360-bbb-3(b)(1), unless the  authorization is terminated or revoked sooner.  Performed at [facility], [address], [place], [place] [address]     Radiology Reports DG Chest 2 View  Result Date: 03/25/2020 CLINICAL DATA:  Shortness of breath for several days EXAM: CHEST - 2 VIEW COMPARISON:  10/13/2009 FINDINGS: Cardiac shadow is stable. The lungs are well aerated bilaterally. Patchy bilateral airspace opacities are noted particularly in the lower lobe on the left. No sizable effusion is noted. No bony abnormality is seen. IMPRESSION: Patchy multifocal airspace opacities consistent with atypical pneumonia. Electronically Signed   By: Inez Catalina M.D.   On: 03/25/2020 14:29

## 2020-03-28 LAB — COMPREHENSIVE METABOLIC PANEL
ALT: 123 U/L — ABNORMAL HIGH (ref 0–44)
AST: 62 U/L — ABNORMAL HIGH (ref 15–41)
Albumin: 2.4 g/dL — ABNORMAL LOW (ref 3.5–5.0)
Alkaline Phosphatase: 104 U/L (ref 38–126)
Anion gap: 8 (ref 5–15)
BUN: 29 mg/dL — ABNORMAL HIGH (ref 6–20)
CO2: 25 mmol/L (ref 22–32)
Calcium: 8.8 mg/dL — ABNORMAL LOW (ref 8.9–10.3)
Chloride: 102 mmol/L (ref 98–111)
Creatinine, Ser: 0.97 mg/dL (ref 0.61–1.24)
GFR calc Af Amer: >60 mL/min (ref >60)
GFR calc non Af Amer: >60 mL/min (ref >60)
Glucose, Bld: 136 mg/dL — ABNORMAL HIGH (ref 70–99)
Potassium: 4 mmol/L (ref 3.5–5.1)
Sodium: 135 mmol/L (ref 135–145)
Total Bilirubin: 0.5 mg/dL (ref 0.3–1.2)
Total Protein: 5.5 g/dL — ABNORMAL LOW (ref 6.5–8.1)

## 2020-03-28 LAB — CBC
HCT: 42.4 % (ref 39.0–52.0)
Hemoglobin: 14.5 g/dL (ref 13.0–17.0)
MCH: 31.1 pg (ref 26.0–34.0)
MCHC: 34.2 g/dL (ref 30.0–36.0)
MCV: 91 fL (ref 80.0–100.0)
Platelets: 486 10*3/uL — ABNORMAL HIGH (ref 150–400)
RBC: 4.66 MIL/uL (ref 4.22–5.81)
RDW: 12.3 % (ref 11.5–15.5)
WBC: 16.6 10*3/uL — ABNORMAL HIGH (ref 4.0–10.5)
nRBC: 0 % (ref 0.0–0.2)

## 2020-03-28 LAB — FERRITIN: Ferritin: 1404 ng/mL — ABNORMAL HIGH (ref 24–336)

## 2020-03-28 LAB — C-REACTIVE PROTEIN: CRP: 8.2 mg/dL — ABNORMAL HIGH (ref <1.0)

## 2020-03-28 LAB — D-DIMER, QUANTITATIVE: D-Dimer, Quant: 0.88 ug/mL-FEU — ABNORMAL HIGH (ref 0.00–0.50)

## 2020-03-28 MED ORDER — MELATONIN 5 MG PO TABS
5.0000 mg | ORAL_TABLET | Freq: Every evening | ORAL | Status: DC | PRN
  Administered 2020-03-28: 5 mg via ORAL
  Filled 2020-03-28 (×2): qty 1

## 2020-03-28 MED ORDER — ALUM & MAG HYDROXIDE-SIMETH 200-200-20 MG/5ML PO SUSP
15.0000 mL | ORAL | Status: DC | PRN
  Administered 2020-03-28 – 2020-03-29 (×3): 15 mL via ORAL
  Filled 2020-03-28 (×3): qty 30

## 2020-03-28 NOTE — Evaluation (Signed)
Physical Therapy Evaluation Patient Details Name: Matthew Mack MRN: [id number] DOB: 07-11-1966 Today's Date: 03/28/2020   History of Present Illness  54 y.o. male admitted on 03/25/20 for SOB.  Found to have COVID 19 PNA and acute hypoxic respiratory failure requiring supplemental O2.  Pt with significant PMH of diverticulitis, s/p laparoscopic sigmoid colectomy.  Clinical Impression  Pt with only mild gait instability, but does require more O2 during gait than at rest (see separate O2 ambulatory sat note).  Increased DOE during gait as well.  He was able to continue walking and talking and his cognition and general strength all appear intact.  I reviewed incentive spirometer (IS) and flutter valve (FV) use every hour.  I encouraged hourly ambulation around the room.  Pt agreeable.   PT to follow acutely for deficits listed below.      Follow Up Recommendations No PT follow up    Equipment Recommendations  Other (comment) (home O2)    Recommendations for Other Services   NA    Precautions / Restrictions Precautions Precautions: Other (comment) Precaution Comments: monitor O2, do ambulatory sats when you are there      Mobility  Bed Mobility Overal bed mobility: Modified Independent                Transfers Overall transfer level: Modified independent                  Ambulation/Gait Ambulation/Gait assistance: Supervision Gait Distance (Feet): 200 Feet Assistive device: None Gait Pattern/deviations: Step-through pattern;Staggering left;Staggering right Gait velocity: decreased Gait velocity interpretation: 1.31 - 2.62 ft/sec, indicative of limited community ambulator General Gait Details: Pt with mildly staggering, slow gait pattern, increased dOE to 2/4 during gait and O2 sats dropped on RA requiring 4 L O2 [place] to maintain in the low to mid 80s while walking.  It took him ~ 5 mins to recover once seated.          Balance Overall balance assessment: Mild  deficits observed, not formally tested                                           Pertinent Vitals/Pain Pain Assessment: No/denies pain    Home Living Family/patient expects to be discharged to:: Private residence Living Arrangements: Spouse/significant other;Children (wife and 90 y.o. daughter) Available Help at Discharge: Family;Available 24 hours/day (wife does not work) Type of Home: [employer]: None      Prior Function Level of Independence: Independent         Comments: fully independent, working Psychologist, sport and exercise), likes to rebuild muscle cars as a hobby.  He also drag races.      Hand Dominance   Dominant Hand: Right    Extremity/Trunk Assessment   Upper Extremity Assessment Upper Extremity Assessment: Overall WFL for tasks assessed    Lower Extremity Assessment Lower Extremity Assessment: Overall WFL for tasks assessed    Cervical / Trunk Assessment Cervical / Trunk Assessment: Normal  Communication   Communication: No difficulties  Cognition Arousal/Alertness: Awake/alert Behavior During Therapy: WFL for tasks assessed/performed Overall Cognitive Status: Within Functional Limits for tasks assessed  Exercises Other Exercises Other Exercises: FV and IS reviewed and pt able to pull ~1000 mL max inspired volume on IS.  Other Exercises: Encouraged every hour for pt to stand up and do laps around the end of his bed unitl his O2 number drops below 85 and then sit and rest.    Assessment/Plan    PT Assessment Patient needs continued PT services  PT Problem List Decreased activity tolerance;Decreased balance;Cardiopulmonary status limiting activity       PT Treatment Interventions DME instruction;Gait training;Stair training;Functional mobility training;Therapeutic activities;Therapeutic exercise;Neuromuscular re-education;Balance training;Cognitive  remediation;Patient/family education    PT Goals (Current goals can be found in the Care Plan section)  Acute Rehab PT Goals Patient Stated Goal: to do everything he is supposed to do so he can go home soon PT Goal Formulation: With patient Time For Goal Achievement: 04/11/20 Potential to Achieve Goals: Good    Frequency Min 3X/week           AM-PAC PT "6 Clicks" Mobility  Outcome Measure Help needed turning from your back to your side while in a flat bed without using bedrails?: None Help needed moving from lying on your back to sitting on the side of a flat bed without using bedrails?: None Help needed moving to and from a bed to a chair (including a wheelchair)?: None Help needed standing up from a chair using your arms (e.g., wheelchair or bedside chair)?: None Help needed to walk in hospital room?: None Help needed climbing 3-5 steps with a railing? : A Little 6 Click Score: 23    End of Session Equipment Utilized During Treatment: Oxygen Activity Tolerance: Patient limited by fatigue Patient left: in chair;with call bell/phone within reach   PT Visit Diagnosis: Difficulty in walking, not elsewhere classified (R26.2)    Time: 1749-4496 PT Time Calculation (min) (ACUTE ONLY): 19 min   Charges:   PT Evaluation $PT Eval Moderate Complexity: [place], PT, DPT  Acute Rehabilitation #[phone] pager #[phone] office        03/28/2020, 2:19 PM

## 2020-03-28 NOTE — Progress Notes (Signed)
SATURATION QUALIFICATIONS: (This note is used to comply with regulatory documentation for home oxygen)  Patient Saturations on Room Air at Rest = 90%  Patient Saturations on Room Air while Ambulating = 80%  Patient Saturations on 4 Liters of oxygen while Ambulating = 83% with 2/4 DOE, but still able to walk slowly and talk  Please briefly explain why patient needs home oxygen: pt desaturates on RA with mobility.   Corinna Capra, PT, DPT  Acute Rehabilitation [phone] pager [phone] office

## 2020-03-28 NOTE — Progress Notes (Signed)
PROGRESS NOTE                                                                                                                                                                                                             Patient Demographics:    Matthew Mack, is a 54 y.o. male, DOB - [birth year], ZDG:[id number]  Outpatient Primary MD for the patient is Aliene Beams, MD   Admit date - 03/25/2020   LOS - 3  Chief Complaint  Patient presents with  . Chest Pain       Brief Narrative: Patient is a 54 y.o. male with PMHx of colectomy-for a perforated diverticulitis in April-presenting with shortness of breath-found to have hypoxia associated with COVID-19 pneumonia.  Significant Events: 6/15>> admit to [facility] for hypoxia of due to COVID-19  COVID-19 medications: Steroids: 6/15>> Remdesivir: 6/15>>  Antibiotics: None  Microbiology data: 6/15: Blood culture>> negative  Procedures: None  Consults: None  DVT prophylaxis: enoxaparin (LOVENOX) injection 40 mg Start: 03/26/20 0900 SCDs Start: 03/25/20 2326   Subjective:   Lying comfortably in bed-feels better-stable on just 2 L of oxygen.   Assessment  & Plan :   Acute Hypoxic Resp Failure due to Covid 19 Viral pneumonia: Slowly improving-down to 2 L of oxygen (on 4 L yesterday) continue steroids/remdesivir-mobilize as much as possible  Longstanding history of diverticulitis-s/p sigmoid colectomy last month-probably prudent to avoid Actemra if patient deteriorates.  Do not think patient had sepsis physiology present on admission.  Fever: afebrile Prone/Incentive Spirometry: encouraged incentive spirometry use 3-4/hour. O2 requirements:  SpO2: 90 % O2 Flow Rate (L/min): 4 L/min   COVID-19 Labs: Recent Labs    03/25/20 1855 03/25/20 2008 03/26/20 0219 03/27/20 0344 03/28/20 0331  DDIMER  --    < > 2.33* 1.41* 0.88*  FERRITIN 2,024*   < > 2,022* 2,446*  1,404*  LDH 325*  --  354*  --   --   CRP 31.4*   < > 32.7* 18.5* 8.2*   < > = values in this interval not displayed.    No results found for: BNP  Recent Labs  Lab 03/25/20 1850  PROCALCITON 0.12    Lab Results  Component Value Date   SARSCOV2NAA POSITIVE (A) 03/25/2020   SARSCOV2NAA NEGATIVE 01/14/2020   SARSCOV2NAA NEGATIVE 06/23/2019    Transaminitis: Appears to  be mild-likely secondary to COVID-19-watch closely while on remdesivir.  History of recent sigmoid colectomy for perforated sigmoid diverticulitis on 01/17/2020: Abdominal exam is benign   ABG:    Component Value Date/Time   PHART 7.460 (H) 03/26/2020 0250   PCO2ART 34.9 03/26/2020 0250   PO2ART 179 (H) 03/26/2020 0250   HCO3 24.5 03/26/2020 0250   O2SAT 99.2 03/26/2020 0250    Vent Settings: N/A  Condition - Stable  Family Communication  : left voicemail for patient spouse on 6/17  Code Status :  Full Code  Diet :  Diet Order            Diet regular Room service appropriate? Yes; Fluid consistency: Thin  Diet effective now                  Disposition Plan  :   Status is: Inpatient  Remains inpatient appropriate because:Inpatient level of care appropriate due to severity of illness   Dispo: The patient is from: Home              Anticipated d/c is to: Home              Anticipated d/c date is: 2 days              Patient currently is not medically stable to d/c.   Barriers to discharge: Hypoxia requiring O2 supplementation/complete 5 days of IV Remdesivir  Antimicorbials  :    Anti-infectives (From admission, onward)   Start     Dose/Rate Route Frequency Ordered Stop   03/27/20 1000  remdesivir 100 mg in sodium chloride 0.9 % 100 mL IVPB     Discontinue    "Followed by" Linked Group Details   100 mg 200 mL/hr over 30 Minutes Intravenous Daily 03/25/20 2340 03/31/20 0959   03/26/20 0000  remdesivir 200 mg in sodium chloride 0.9% 250 mL IVPB       "Followed by" Linked Group Details     200 mg 580 mL/hr over 30 Minutes Intravenous Once 03/25/20 2340 03/26/20 0330      Inpatient Medications  Scheduled Meds: . benzonatate  200 mg Oral TID  . enoxaparin (LOVENOX) injection  40 mg Subcutaneous Q24H  . methylPREDNISolone (SOLU-MEDROL) injection  40 mg Intravenous Q12H  . sodium chloride flush  3 mL Intravenous Q12H   Continuous Infusions: . remdesivir 100 mg in NS 100 mL 100 mg (03/28/20 0922)   PRN Meds:.acetaminophen **OR** acetaminophen, albuterol, alum & mag hydroxide-simeth, chlorpheniramine-HYDROcodone, ondansetron **OR** ondansetron (ZOFRAN) IV   Time Spent in minutes  25  See all Orders from today for further details   Jeoffrey Massed M.D on 03/28/2020 at 3:19 PM  To page go to [email or web address] - use universal password  [facility] -  Office  [phone]    Objective:   Vitals:   03/28/20 0000 03/28/20 0400 03/28/20 0822 03/28/20 1128  BP: 132/74 131/85 131/85 122/81  Pulse: 64 61 75 66  Resp: 18   18  Temp: 97.9 F (36.6 C) 98 F (36.7 C) 97.8 F (36.6 C)   TempSrc: Oral Oral Oral   SpO2: 93% 92% (!) 88% 90%  Weight:  90 kg    Height:        Wt Readings from Last 3 Encounters:  03/28/20 90 kg  01/20/20 98.2 kg  01/09/20 99.2 kg     Intake/Output Summary (Last 24 hours) at 03/28/2020 1519 Last data filed at 03/28/2020 0513 Gross per 24 hour  Intake --  Output 1000 ml  Net -1000 ml     Physical Exam Gen Exam:Alert awake-not in any distress HEENT:atraumatic, normocephalic Chest: B/L clear to auscultation anteriorly CVS:S1S2 regular Abdomen:soft non tender, non distended Extremities:no edema Neurology: Non focal Skin: no rash   Data Review:    CBC Recent Labs  Lab 03/25/20 1356 03/26/20 0219 03/27/20 0344 03/28/20 0331  WBC 8.0 8.3 14.8* 16.6*  HGB 16.0 15.7 14.5 14.5  HCT 47.7 46.6 42.4 42.4  PLT 256 301 388 486*  MCV 93.7 94.5 90.8 91.0  MCH 31.4 31.8 31.0 31.1  MCHC 33.5 33.7 34.2 34.2  RDW 12.4 12.7  12.1 12.3  LYMPHSABS  --  0.5*  --   --   MONOABS  --  0.3  --   --   EOSABS  --  0.0  --   --   BASOSABS  --  0.1  --   --     Chemistries  Recent Labs  Lab 03/25/20 1356 03/26/20 0219 03/27/20 0344 03/28/20 0331  NA 138 138 138 135  K 3.8 4.7 4.5 4.0  CL 101 101 103 102  CO2 25 21* 26 25  GLUCOSE 101* 134* 152* 136*  BUN 13 19 25* 29*  CREATININE 0.97 0.99 1.06 0.97  CALCIUM 9.2 9.1 9.1 8.8*  MG  --  2.2  2.3  --   --   AST  --  66*  62* 117* 62*  ALT  --  73*  71* 141* 123*  ALKPHOS  --  131*  126 125 104  BILITOT  --  0.8  0.7 0.6 0.5   ------------------------------------------------------------------------------------------------------------------ Recent Labs    03/25/20 1855  TRIG 115    Lab Results  Component Value Date   HGBA1C 5.3 01/09/2020   ------------------------------------------------------------------------------------------------------------------ No results for input(s): TSH, T4TOTAL, T3FREE, THYROIDAB in the last 72 hours.  Invalid input(s): FREET3 ------------------------------------------------------------------------------------------------------------------ Recent Labs    03/27/20 0344 03/28/20 0331  FERRITIN 2,446* 1,404*    Coagulation profile No results for input(s): INR, PROTIME in the last 168 hours.  Recent Labs    03/27/20 0344 03/28/20 0331  DDIMER 1.41* 0.88*    Cardiac Enzymes No results for input(s): CKMB, TROPONINI, MYOGLOBIN in the last 168 hours.  Invalid input(s): CK ------------------------------------------------------------------------------------------------------------------ No results found for: BNP  Micro Results Recent Results (from the past 240 hour(s))  Blood Culture (routine x 2)     Status: None (Preliminary result)   Collection Time: 03/25/20  7:02 PM   Specimen: BLOOD LEFT HAND  Result Value Ref Range Status   Specimen Description BLOOD LEFT HAND  Final   Special Requests   Final     BOTTLES DRAWN AEROBIC AND ANAEROBIC Blood Culture adequate volume   Culture   Final    NO GROWTH 3 DAYS Performed at [facility], [place] [address], [place], [place] [address]    Report Status PENDING  Incomplete  Blood Culture (routine x 2)     Status: None (Preliminary result)   Collection Time: 03/25/20  7:04 PM   Specimen: BLOOD RIGHT HAND  Result Value Ref Range Status   Specimen Description BLOOD RIGHT HAND  Final   Special Requests   Final    BOTTLES DRAWN AEROBIC AND ANAEROBIC Blood Culture adequate volume   Culture   Final    NO GROWTH 3 DAYS Performed at [facility], [place] [address], [place],  [address]    Report Status PENDING  Incomplete  SARS Coronavirus 2 by RT PCR (hospital order, performed in [facility] hospital lab) Nasopharyngeal Nasopharyngeal Swab     Status: Abnormal   Collection Time: 03/25/20  7:04 PM   Specimen: Nasopharyngeal Swab  Result Value Ref Range Status   SARS Coronavirus 2 POSITIVE (A) NEGATIVE Final    Comment: RESULT CALLED TO, READ BACK BY AND VERIFIED WITH: L VENEGAS RN 2028 03/25/20 A BROWNING (NOTE) SARS-CoV-2 target nucleic acids are DETECTED  SARS-CoV-2 RNA is generally detectable in upper respiratory specimens  during the acute phase of infection.  Positive results are indicative  of the presence of the identified virus, but do not rule out bacterial infection or co-infection with other pathogens not detected by the test.  Clinical correlation with patient history and  other diagnostic information is necessary to determine patient infection status.  The expected result is negative.  Fact Sheet for Patients:   BoilerBrush.com.cy   Fact Sheet for Healthcare Providers:   https://pope.com/    This test is not yet approved or cleared by the [place] FDA and  has been authorized for detection and/or diagnosis of SARS-CoV-2 by FDA under an Emergency Use Authorization  (EUA).  This EUA will remain in effect (meaning this  test can be used) for the duration of  the COVID-19 declaration under Section 564(b)(1) of the Act, 21 U.S.C. section 360-bbb-3(b)(1), unless the authorization is terminated or revoked sooner.  Performed at [facility], [address], [place], [place] [address]     Radiology Reports DG Chest 2 View  Result Date: 03/25/2020 CLINICAL DATA:  Shortness of breath for several days EXAM: CHEST - 2 VIEW COMPARISON:  10/13/2009 FINDINGS: Cardiac shadow is stable. The lungs are well aerated bilaterally. Patchy bilateral airspace opacities are noted particularly in the lower lobe on the left. No sizable effusion is noted. No bony abnormality is seen. IMPRESSION: Patchy multifocal airspace opacities consistent with atypical pneumonia. Electronically Signed   By: Alcide Clever M.D.   On: 03/25/2020 14:29

## 2020-03-29 DIAGNOSIS — J1282 Pneumonia due to coronavirus disease 2019: Secondary | ICD-10-CM

## 2020-03-29 DIAGNOSIS — U071 COVID-19: Secondary | ICD-10-CM

## 2020-03-29 LAB — COMPREHENSIVE METABOLIC PANEL
ALT: 136 U/L — ABNORMAL HIGH (ref 0–44)
AST: 64 U/L — ABNORMAL HIGH (ref 15–41)
Albumin: 2.4 g/dL — ABNORMAL LOW (ref 3.5–5.0)
Alkaline Phosphatase: 106 U/L (ref 38–126)
Anion gap: 8 (ref 5–15)
BUN: 25 mg/dL — ABNORMAL HIGH (ref 6–20)
CO2: 26 mmol/L (ref 22–32)
Calcium: 8.7 mg/dL — ABNORMAL LOW (ref 8.9–10.3)
Chloride: 103 mmol/L (ref 98–111)
Creatinine, Ser: 0.92 mg/dL (ref 0.61–1.24)
GFR calc Af Amer: >60 mL/min (ref >60)
GFR calc non Af Amer: >60 mL/min (ref >60)
Glucose, Bld: 130 mg/dL — ABNORMAL HIGH (ref 70–99)
Potassium: 4.6 mmol/L (ref 3.5–5.1)
Sodium: 137 mmol/L (ref 135–145)
Total Bilirubin: 0.7 mg/dL (ref 0.3–1.2)
Total Protein: 5.5 g/dL — ABNORMAL LOW (ref 6.5–8.1)

## 2020-03-29 LAB — D-DIMER, QUANTITATIVE: D-Dimer, Quant: 1.01 ug/mL-FEU — ABNORMAL HIGH (ref 0.00–0.50)

## 2020-03-29 LAB — FERRITIN: Ferritin: 1081 ng/mL — ABNORMAL HIGH (ref 24–336)

## 2020-03-29 LAB — BRAIN NATRIURETIC PEPTIDE: B Natriuretic Peptide: 61.1 pg/mL (ref 0.0–100.0)

## 2020-03-29 LAB — CBC
HCT: 43.9 % (ref 39.0–52.0)
Hemoglobin: 14.6 g/dL (ref 13.0–17.0)
MCH: 30.4 pg (ref 26.0–34.0)
MCHC: 33.3 g/dL (ref 30.0–36.0)
MCV: 91.3 fL (ref 80.0–100.0)
Platelets: 427 10*3/uL — ABNORMAL HIGH (ref 150–400)
RBC: 4.81 MIL/uL (ref 4.22–5.81)
RDW: 12.4 % (ref 11.5–15.5)
WBC: 13.4 10*3/uL — ABNORMAL HIGH (ref 4.0–10.5)
nRBC: 0 % (ref 0.0–0.2)

## 2020-03-29 LAB — C-REACTIVE PROTEIN: CRP: 5.5 mg/dL — ABNORMAL HIGH (ref <1.0)

## 2020-03-29 LAB — MAGNESIUM: Magnesium: 2.5 mg/dL — ABNORMAL HIGH (ref 1.7–2.4)

## 2020-03-29 MED ORDER — PANTOPRAZOLE SODIUM 40 MG PO TBEC
40.0000 mg | DELAYED_RELEASE_TABLET | Freq: Two times a day (BID) | ORAL | Status: DC
  Administered 2020-03-29 – 2020-03-30 (×2): 40 mg via ORAL
  Filled 2020-03-29 (×3): qty 1

## 2020-03-29 NOTE — Plan of Care (Signed)
  Problem: Respiratory: Goal: Will maintain a patent airway Outcome: Progressing Goal: Complications related to the disease process, condition or treatment will be avoided or minimized Outcome: Progressing   

## 2020-03-29 NOTE — Progress Notes (Signed)
PROGRESS NOTE                                                                                                                                                                                                             Patient Demographics:    Matthew Mack, is a 54 y.o. male, DOB - 06/04/1966, YQM:[id number]  Outpatient Primary MD for the patient is Aliene Beams, MD   Admit date - 03/25/2020   LOS - 4  Chief Complaint  Patient presents with   Chest Pain       Brief Narrative: Patient is a 54 y.o. male with PMHx of colectomy-for a perforated diverticulitis in April-presenting with shortness of breath-found to have hypoxia associated with COVID-19 pneumonia.  Significant Events: 6/15>> admit to [facility] for hypoxia of due to COVID-19  COVID-19 medications: Steroids: 6/15>> Remdesivir: 6/15>>  Antibiotics: None  Microbiology data: 6/15: Blood culture>> negative  Procedures: None  Consults: None  DVT prophylaxis: enoxaparin (LOVENOX) injection 40 mg Start: 03/26/20 0900 SCDs Start: 03/25/20 2326   Subjective:   Patient in bed, appears comfortable, denies any headache, no fever, no chest pain or pressure, no shortness of breath , no abdominal pain. No focal weakness.   Assessment  & Plan :   Acute Hypoxic Resp Failure due to Covid 19 Viral pneumonia: Slowly improving-down to 2 L of oxygen (on 4 L yesterday) continue steroids/remdesivir-mobilize as much as possible  Longstanding history of diverticulitis-s/p sigmoid colectomy last month-probably prudent to avoid Actemra if patient deteriorates.  Do not think patient had sepsis physiology present on admission.    SpO2: 95 % O2 Flow Rate (L/min): 2 L/min   COVID-19 Labs: Recent Labs    03/27/20 0344 03/28/20 0331 03/29/20 0325  DDIMER 1.41* 0.88* 1.01*  FERRITIN 2,446* 1,404* 1,081*  CRP 18.5* 8.2* 5.5*    No results found for: BNP  Recent Labs   Lab 03/25/20 1850  PROCALCITON 0.12    Lab Results  Component Value Date   SARSCOV2NAA POSITIVE (A) 03/25/2020   SARSCOV2NAA NEGATIVE 01/14/2020   SARSCOV2NAA NEGATIVE 06/23/2019    Transaminitis: Appears to be mild-likely secondary to COVID-19-watch closely while on remdesivir.  History of recent sigmoid colectomy for perforated sigmoid diverticulitis on 01/17/2020: Abdominal exam is benign    Condition - Stable  Family Communication  : left voicemail for patient  spouse on 6/17  Code Status :  Full Code  Diet :  Diet Order            Diet regular Room service appropriate? Yes; Fluid consistency: Thin  Diet effective now                  Disposition Plan  :   Status is: Inpatient  Remains inpatient appropriate because:Inpatient level of care appropriate due to severity of illness   Dispo: The patient is from: Home              Anticipated d/c is to: Home              Anticipated d/c date is: 2 days              Patient currently is not medically stable to d/c.   Barriers to discharge: Hypoxia requiring O2 supplementation/complete 5 days of IV Remdesivir  Antimicorbials  :    Anti-infectives (From admission, onward)   Start     Dose/Rate Route Frequency Ordered Stop   03/27/20 1000  remdesivir 100 mg in sodium chloride 0.9 % 100 mL IVPB     Discontinue    "Followed by" Linked Group Details   100 mg 200 mL/hr over 30 Minutes Intravenous Daily 03/25/20 2340 03/31/20 0959   03/26/20 0000  remdesivir 200 mg in sodium chloride 0.9% 250 mL IVPB       "Followed by" Linked Group Details   200 mg 580 mL/hr over 30 Minutes Intravenous Once 03/25/20 2340 03/26/20 0330      Inpatient Medications  Scheduled Meds:  benzonatate  200 mg Oral TID   enoxaparin (LOVENOX) injection  40 mg Subcutaneous Q24H   methylPREDNISolone (SOLU-MEDROL) injection  40 mg Intravenous Q12H   sodium chloride flush  3 mL Intravenous Q12H   Continuous Infusions:  remdesivir 100 mg  in NS 100 mL 100 mg (03/29/20 0916)   PRN Meds:.acetaminophen **OR** [DISCONTINUED] acetaminophen, albuterol, alum & mag hydroxide-simeth, chlorpheniramine-HYDROcodone, melatonin, ondansetron **OR** ondansetron (ZOFRAN) IV   Time Spent in minutes  25  See all Orders from today for further details   Lala Lund M.D on 03/29/2020 at 9:48 AM  To page go to [email or web address] - use universal password  [facility] -  Office  [phone]    Objective:   Vitals:   03/28/20 1128 03/28/20 2205 03/29/20 0609 03/29/20 0910  BP: 122/81 131/89 (!) 136/91 132/87  Pulse: 66 65 (!) 57 (!) 58  Resp: 18 20 15 18   Temp:  97.9 F (36.6 C) 97.8 F (36.6 C) 98 F (36.7 C)  TempSrc:  Oral Oral Oral  SpO2: 90% 90% 96% 95%  Weight:      Height:        Wt Readings from Last 3 Encounters:  03/28/20 90 kg  01/20/20 98.2 kg  01/09/20 99.2 kg     Intake/Output Summary (Last 24 hours) at 03/29/2020 0948 Last data filed at 03/29/2020 0916 Gross per 24 hour  Intake 106 ml  Output --  Net 106 ml     Physical Exam  Awake Alert, No new F.N deficits, Normal affect [place].AT,PERRAL Supple Neck,No JVD, No cervical lymphadenopathy appriciated.  Symmetrical Chest wall movement, Good air movement bilaterally, CTAB RRR,No Gallops, Rubs or new Murmurs, No Parasternal Heave +ve B.Sounds, Abd Soft, No tenderness, No organomegaly appriciated, No rebound - guarding or rigidity. No Cyanosis, Clubbing or edema, No new Rash or bruise    Data Review:  CBC Recent Labs  Lab 03/25/20 1356 03/26/20 0219 03/27/20 0344 03/28/20 0331 03/29/20 0325  WBC 8.0 8.3 14.8* 16.6* 13.4*  HGB 16.0 15.7 14.5 14.5 14.6  HCT 47.7 46.6 42.4 42.4 43.9  PLT 256 301 388 486* 427*  MCV 93.7 94.5 90.8 91.0 91.3  MCH 31.4 31.8 31.0 31.1 30.4  MCHC 33.5 33.7 34.2 34.2 33.3  RDW 12.4 12.7 12.1 12.3 12.4  LYMPHSABS  --  0.5*  --   --   --   MONOABS  --  0.3  --   --   --   EOSABS  --  0.0  --   --   --   BASOSABS   --  0.1  --   --   --     Chemistries  Recent Labs  Lab 03/25/20 1356 03/26/20 0219 03/27/20 0344 03/28/20 0331 03/29/20 0325  NA 138 138 138 135 137  K 3.8 4.7 4.5 4.0 4.6  CL 101 101 103 102 103  CO2 25 21* 26 25 26   GLUCOSE 101* 134* 152* 136* 130*  BUN 13 19 25* 29* 25*  CREATININE 0.97 0.99 1.06 0.97 0.92  CALCIUM 9.2 9.1 9.1 8.8* 8.7*  MG  --  2.2   2.3  --   --   --   AST  --  66*   62* 117* 62* 64*  ALT  --  73*   71* 141* 123* 136*  ALKPHOS  --  131*   126 125 104 106  BILITOT  --  0.8   0.7 0.6 0.5 0.7   ------------------------------------------------------------------------------------------------------------------ No results for input(s): CHOL, HDL, LDLCALC, TRIG, CHOLHDL, LDLDIRECT in the last 72 hours.  Lab Results  Component Value Date   HGBA1C 5.3 01/09/2020   ------------------------------------------------------------------------------------------------------------------ No results for input(s): TSH, T4TOTAL, T3FREE, THYROIDAB in the last 72 hours.  Invalid input(s): FREET3 ------------------------------------------------------------------------------------------------------------------ Recent Labs    03/28/20 0331 03/29/20 0325  FERRITIN 1,404* 1,081*    Coagulation profile No results for input(s): INR, PROTIME in the last 168 hours.  Recent Labs    03/28/20 0331 03/29/20 0325  DDIMER 0.88* 1.01*    Cardiac Enzymes No results for input(s): CKMB, TROPONINI, MYOGLOBIN in the last 168 hours.  Invalid input(s): CK ------------------------------------------------------------------------------------------------------------------ No results found for: BNP  Micro Results Recent Results (from the past 240 hour(s))  Blood Culture (routine x 2)     Status: None (Preliminary result)   Collection Time: 03/25/20  7:02 PM   Specimen: BLOOD LEFT HAND  Result Value Ref Range Status   Specimen Description BLOOD LEFT HAND  Final   Special Requests    Final    BOTTLES DRAWN AEROBIC AND ANAEROBIC Blood Culture adequate volume   Culture   Final    NO GROWTH 3 DAYS Performed at [facility], [address] [place], [place]    Report Status PENDING  Incomplete  Blood Culture (routine x 2)     Status: None (Preliminary result)   Collection Time: 03/25/20  7:04 PM   Specimen: BLOOD RIGHT HAND  Result Value Ref Range Status   Specimen Description BLOOD RIGHT HAND  Final   Special Requests   Final    BOTTLES DRAWN AEROBIC AND ANAEROBIC Blood Culture adequate volume   Culture   Final    NO GROWTH 3 DAYS Performed at [facility], [address] [place], [place]    Report Status PENDING  Incomplete  SARS Coronavirus  2 by RT PCR (hospital order, performed in [facility] hospital lab) Nasopharyngeal Nasopharyngeal Swab     Status: Abnormal   Collection Time: 03/25/20  7:04 PM   Specimen: Nasopharyngeal Swab  Result Value Ref Range Status   SARS Coronavirus 2 POSITIVE (A) NEGATIVE Final    Comment: RESULT CALLED TO, READ BACK BY AND VERIFIED WITH: L VENEGAS RN 2028 03/25/20 A BROWNING (NOTE) SARS-CoV-2 target nucleic acids are DETECTED  SARS-CoV-2 RNA is generally detectable in upper respiratory specimens  during the acute phase of infection.  Positive results are indicative  of the presence of the identified virus, but do not rule out bacterial infection or co-infection with other pathogens not detected by the test.  Clinical correlation with patient history and  other diagnostic information is necessary to determine patient infection status.  The expected result is negative.  Fact Sheet for Patients:   BoilerBrush.com.cy   Fact Sheet for Healthcare Providers:   https://pope.com/    This test is not yet approved or cleared by the [place] FDA and  has been authorized for detection and/or diagnosis of SARS-CoV-2 by FDA under an Emergency Use  Authorization (EUA).  This EUA will remain in effect (meaning this  test can be used) for the duration of  the COVID-19 declaration under Section 564(b)(1) of the Act, 21 U.S.C. section 360-bbb-3(b)(1), unless the authorization is terminated or revoked sooner.  Performed at [facility], [address], [place], [place] [address]     Radiology Reports DG Chest 2 View  Result Date: 03/25/2020 CLINICAL DATA:  Shortness of breath for several days EXAM: CHEST - 2 VIEW COMPARISON:  10/13/2009 FINDINGS: Cardiac shadow is stable. The lungs are well aerated bilaterally. Patchy bilateral airspace opacities are noted particularly in the lower lobe on the left. No sizable effusion is noted. No bony abnormality is seen. IMPRESSION: Patchy multifocal airspace opacities consistent with atypical pneumonia. Electronically Signed   By: Alcide Clever M.D.   On: 03/25/2020 14:29

## 2020-03-30 LAB — CBC
HCT: 45.5 % (ref 39.0–52.0)
Hemoglobin: 15.2 g/dL (ref 13.0–17.0)
MCH: 30.4 pg (ref 26.0–34.0)
MCHC: 33.4 g/dL (ref 30.0–36.0)
MCV: 91 fL (ref 80.0–100.0)
Platelets: 394 10*3/uL (ref 150–400)
RBC: 5 MIL/uL (ref 4.22–5.81)
RDW: 12.1 % (ref 11.5–15.5)
WBC: 12.8 10*3/uL — ABNORMAL HIGH (ref 4.0–10.5)
nRBC: 0 % (ref 0.0–0.2)

## 2020-03-30 LAB — CULTURE, BLOOD (ROUTINE X 2)
Culture: NO GROWTH
Culture: NO GROWTH
Special Requests: ADEQUATE
Special Requests: ADEQUATE

## 2020-03-30 LAB — COMPREHENSIVE METABOLIC PANEL
ALT: 109 U/L — ABNORMAL HIGH (ref 0–44)
AST: 37 U/L (ref 15–41)
Albumin: 2.4 g/dL — ABNORMAL LOW (ref 3.5–5.0)
Alkaline Phosphatase: 97 U/L (ref 38–126)
Anion gap: 8 (ref 5–15)
BUN: 21 mg/dL — ABNORMAL HIGH (ref 6–20)
CO2: 25 mmol/L (ref 22–32)
Calcium: 8.6 mg/dL — ABNORMAL LOW (ref 8.9–10.3)
Chloride: 102 mmol/L (ref 98–111)
Creatinine, Ser: 0.96 mg/dL (ref 0.61–1.24)
GFR calc Af Amer: >60 mL/min (ref >60)
GFR calc non Af Amer: >60 mL/min (ref >60)
Glucose, Bld: 131 mg/dL — ABNORMAL HIGH (ref 70–99)
Potassium: 4.8 mmol/L (ref 3.5–5.1)
Sodium: 135 mmol/L (ref 135–145)
Total Bilirubin: 0.5 mg/dL (ref 0.3–1.2)
Total Protein: 5.7 g/dL — ABNORMAL LOW (ref 6.5–8.1)

## 2020-03-30 LAB — MAGNESIUM: Magnesium: 2.5 mg/dL — ABNORMAL HIGH (ref 1.7–2.4)

## 2020-03-30 LAB — D-DIMER, QUANTITATIVE: D-Dimer, Quant: 1.21 ug/mL-FEU — ABNORMAL HIGH (ref 0.00–0.50)

## 2020-03-30 LAB — BRAIN NATRIURETIC PEPTIDE: B Natriuretic Peptide: 44.1 pg/mL (ref 0.0–100.0)

## 2020-03-30 LAB — C-REACTIVE PROTEIN: CRP: 5.3 mg/dL — ABNORMAL HIGH (ref <1.0)

## 2020-03-30 MED ORDER — FUROSEMIDE 10 MG/ML IJ SOLN
40.0000 mg | Freq: Once | INTRAMUSCULAR | Status: AC
  Administered 2020-03-30: 40 mg via INTRAVENOUS
  Filled 2020-03-30: qty 4

## 2020-03-30 MED ORDER — ALBUTEROL SULFATE HFA 108 (90 BASE) MCG/ACT IN AERS: 2.0000 | INHALATION_SPRAY | Freq: Four times a day (QID) | RESPIRATORY_TRACT | 0 refills | Status: DC | PRN

## 2020-03-30 MED ORDER — METHYLPREDNISOLONE 4 MG PO TBPK
ORAL_TABLET | ORAL | 0 refills | Status: DC
Start: 2020-03-30 — End: 2020-09-24

## 2020-03-30 NOTE — Progress Notes (Signed)
SATURATION QUALIFICATIONS: (This note is used to comply with regulatory documentation for home oxygen)  Patient Saturations on Room Air at Rest = 92%  Patient Saturations on Room Air while Ambulating = 86%  Patient Saturations on 2 Liters of oxygen while Ambulating = 90%  Please briefly explain why patient needs home oxygen: Patient's oxygen on room air before ambulation was 92% on room air.  Patient started walking and he desatted to 86% while on room air.  2 L of oxygen applied via nasal cannula.  Patient's oxygen level came up to 90% on 2 L.

## 2020-03-30 NOTE — Discharge Summary (Signed)
Matthew Mack UJW:[id number] DOB: September 19, 1966 DOA: 03/25/2020  PCP: Matthew Macadam, MD  Admit date: 03/25/2020  Discharge date: 03/30/2020  Admitted From: Home   Disposition:  Home   Recommendations for Outpatient Follow-up:   Follow up with PCP in 1-2 weeks  PCP Please obtain BMP/CBC, 2 view CXR in 1week,  (see Discharge instructions)   PCP Please follow up on the following pending results:    Home Health: None   Equipment/Devices: 2 lits o2 PRN  Consultations: None  Discharge Condition: Stable    CODE STATUS: Full    Diet Recommendation: Heart Healthy   Diet Order            Diet - low sodium heart healthy           Diet regular Room service appropriate? Yes; Fluid consistency: Thin  Diet effective now                  Chief Complaint  Patient presents with  . Chest Pain     Brief history of present illness from the day of admission and additional interim summary    Patient is a 54 y.o. male with PMHx of colectomy-for a perforated diverticulitis in April-presenting with shortness of breath-found to have hypoxia associated with COVID-19 pneumonia                                                                 Hospital Course   Acute Hypoxic Resp Failure due to Covid 19 Viral pneumonia: he moderate disease and initially required 4 L nasal cannula oxygen, treated with IV remdesivir and steroids, much improved, now down to room air at rest upon ambulation requires 1 to 2 L of oxygen.  Will be discharged home on short course of low-dose oral steroid taper along with as needed butyryl inhaler.  At rest symptom-free.  Will be discharged home with PCP follow-up.   SpO2: 92 % O2 Flow Rate (L/min): 1 L/min  Recent Labs  Lab 03/25/20 1850 03/25/20 1855 03/25/20 1904 03/25/20 2008 03/26/20 0219  03/27/20 0344 03/28/20 0331 03/29/20 0325 03/29/20 0930 03/30/20 0409  CRP  --  31.4*  --    < > 32.7* 18.5* 8.2* 5.5*  --  5.3*  DDIMER  --   --   --    < > 2.33* 1.41* 0.88* 1.01*  --  1.21*  FERRITIN  --  2,024*  --   --  2,022* 2,446* 1,404* 1,081*  --   --   BNP  --   --   --   --   --   --   --   --  61.1 44.1  PROCALCITON 0.12  --   --   --   --   --   --   --   --   --  SARSCOV2NAA  --   --  POSITIVE*  --   --   --   --   --   --   --    < > = values in this interval not displayed.    Hepatic Function Latest Ref Rng & Units 03/30/2020 03/29/2020 03/28/2020  Total Protein 6.5 - 8.1 g/dL 5.7(L) 5.5(L) 5.5(L)  Albumin 3.5 - 5.0 g/dL 2.4(L) 2.4(L) 2.4(L)  AST 15 - 41 U/L 37 64(H) 62(H)  ALT 0 - 44 U/L 109(H) 136(H) 123(H)  Alk Phosphatase 38 - 126 U/L 97 106 104  Total Bilirubin 0.3 - 1.2 mg/dL 0.5 0.7 0.5  Bilirubin, Direct 0.0 - 0.2 mg/dL - - -    Transaminitis: Appears to be mild-likely secondary to COVID-19-trend improving patient asymptomatic.  PCP to repeat CMP in 7 to 10 days.  History of recent sigmoid colectomy for perforated sigmoid diverticulitis on 01/17/2020: Abdominal exam is benign, requested to follow with PCP and his primary surgeon in 7 to 10 days.      Discharge diagnosis     Principal Problem:   Pneumonia due to COVID-19 virus Active Problems:   SOB (shortness of breath)   Severe sepsis (HCC)   Hyperlipidemia   Fever    Discharge instructions    Discharge Instructions    Diet - low sodium heart healthy   Complete by: As directed    Discharge instructions   Complete by: As directed    Follow with Primary MD Matthew Macadam, MD in 7 days   Get CBC, CMP, 2 view Chest X ray -  checked next visit within 1 week by Primary MD   Activity: As tolerated with Full fall precautions use walker/cane & assistance as needed  Disposition Home    Diet: Heart Healthy     Special Instructions: If you have smoked or chewed Tobacco  in the last 2 yrs please  stop smoking, stop any regular Alcohol  and or any Recreational drug use.  On your next visit with your primary care physician please Get Medicines reviewed and adjusted.  Please request your Prim.MD to go over all Hospital Tests and Procedure/Radiological results at the follow up, please get all Hospital records sent to your Prim MD by signing hospital release before you go home.  If you experience worsening of your admission symptoms, develop shortness of breath, life threatening emergency, suicidal or homicidal thoughts you must seek medical attention immediately by calling 911 or calling your MD immediately  if symptoms less severe.  You Must read complete instructions/literature along with all the possible adverse reactions/side effects for all the Medicines you take and that have been prescribed to you. Take any new Medicines after you have completely understood and accpet all the possible adverse reactions/side effects.   Increase activity slowly   Complete by: As directed    MyChart COVID-19 home monitoring program   Complete by: Mar 30, 2020    Is the patient willing to use the [place] for home monitoring?: Yes   Temperature monitoring   Complete by: Mar 30, 2020    After how many days would you like to receive a notification of this patient's flowsheet entries?: 1      Discharge Medications   Allergies as of 03/30/2020      Reactions   Penicillins Hives   Sulfa Antibiotics Itching      Medication List    STOP taking these medications   ibuprofen 200 MG tablet Commonly known  as: ADVIL   oxyCODONE 5 MG immediate release tablet Commonly known as: Oxy IR/ROXICODONE     TAKE these medications   albuterol 108 (90 Base) MCG/ACT inhaler Commonly known as: VENTOLIN HFA Inhale 2 puffs into the lungs every 6 (six) hours as needed for wheezing or shortness of breath.   FISH OIL PO Take 1 capsule by mouth daily.   HYDROcodone-acetaminophen 5-325 MG  tablet Commonly known as: NORCO/VICODIN Take 1-2 tablets by mouth every 6 (six) hours as needed for moderate pain.   methylPREDNISolone 4 MG Tbpk tablet Commonly known as: MEDROL DOSEPAK follow package directions   MULTIVITAMIN ADULT PO Take 1 tablet by mouth daily.   VITAMIN B-1 PO Take 1 tablet by mouth daily.   VITAMIN B-12 PO Take 1 tablet by mouth daily.            Durable Medical Equipment  (From admission, onward)         Start     Ordered   03/30/20 0934  For home use only DME oxygen  Once       Question Answer Comment  Length of Need 6 Months   Mode or (Route) Nasal cannula   Liters per Minute 2   Frequency Continuous (stationary and portable oxygen unit needed)   Oxygen delivery system Gas      03/30/20 0933           Follow-up Information    Matthew Macadam, MD. Schedule an appointment as soon as possible for a visit in 1 week(s).   Specialty: Family Medicine Why: And with your surgeon in 1 week Contact information: [place] [address] [phone]               Major procedures and Radiology Reports - PLEASE review detailed and final reports thoroughly  -       DG Chest 2 View  Result Date: 03/25/2020 CLINICAL DATA:  Shortness of breath for several days EXAM: CHEST - 2 VIEW COMPARISON:  10/13/2009 FINDINGS: Cardiac shadow is stable. The lungs are well aerated bilaterally. Patchy bilateral airspace opacities are noted particularly in the lower lobe on the left. No sizable effusion is noted. No bony abnormality is seen. IMPRESSION: Patchy multifocal airspace opacities consistent with atypical pneumonia. Electronically Signed   By: Inez Catalina M.D.   On: 03/25/2020 14:29    Micro Results     Recent Results (from the past 240 hour(s))  Blood Culture (routine x 2)     Status: None   Collection Time: 03/25/20  7:02 PM   Specimen: BLOOD LEFT HAND  Result Value Ref Range Status   Specimen Description BLOOD LEFT HAND   Final   Special Requests   Final    BOTTLES DRAWN AEROBIC AND ANAEROBIC Blood Culture adequate volume   Culture   Final    NO GROWTH 5 DAYS Performed at [facility], [place] [address], [place], [place] [address]    Report Status 03/30/2020 FINAL  Final  Blood Culture (routine x 2)     Status: None   Collection Time: 03/25/20  7:04 PM   Specimen: BLOOD RIGHT HAND  Result Value Ref Range Status   Specimen Description BLOOD RIGHT HAND  Final   Special Requests   Final    BOTTLES DRAWN AEROBIC AND ANAEROBIC Blood Culture adequate volume   Culture   Final    NO GROWTH 5 DAYS Performed at [facility], [place] [address]  [place], [place] [address]    Report Status 03/30/2020 FINAL  Final  SARS Coronavirus 2 by RT PCR (hospital order, performed in [facility] hospital lab) Nasopharyngeal Nasopharyngeal Swab     Status: Abnormal   Collection Time: 03/25/20  7:04 PM   Specimen: Nasopharyngeal Swab  Result Value Ref Range Status   SARS Coronavirus 2 POSITIVE (A) NEGATIVE Final    Comment: RESULT CALLED TO, READ BACK BY AND VERIFIED WITH: L VENEGAS RN 2028 03/25/20 A BROWNING (NOTE) SARS-CoV-2 target nucleic acids are DETECTED  SARS-CoV-2 RNA is generally detectable in upper respiratory specimens  during the acute phase of infection.  Positive results are indicative  of the presence of the identified virus, but do not rule out bacterial infection or co-infection with other pathogens not detected by the test.  Clinical correlation with patient history and  other diagnostic information is necessary to determine patient infection status.  The expected result is negative.  Fact Sheet for Patients:   StrictlyIdeas.no   Fact Sheet for Healthcare Providers:   BankingDealers.co.za    This test is not yet approved or cleared by the [place] FDA and  has been authorized for detection and/or diagnosis of SARS-CoV-2 by FDA under an  Emergency Use Authorization (EUA).  This EUA will remain in effect (meaning this  test can be used) for the duration of  the COVID-19 declaration under Section 564(b)(1) of the Act, 21 U.S.C. section 360-bbb-3(b)(1), unless the authorization is terminated or revoked sooner.  Performed at [facility], [place] [address], [place], [place] [address]     Today   Subjective    Mar [place] today has no headache,no chest abdominal pain,no new weakness tingling or numbness, feels much better wants to go home today.     Objective   Blood pressure (!) 128/92, pulse (!) 54, temperature 98.3 F (36.8 C), temperature source Oral, resp. rate 18, height '5\' 10"'$  (1.778 m), weight 89.1 kg, SpO2 92 %.   Intake/Output Summary (Last 24 hours) at 03/30/2020 0940 Last data filed at 03/30/2020 0933 Gross per 24 hour  Intake 353 ml  Output --  Net 353 ml    Exam  Awake Alert, No new F.N deficits, Normal affect [place].AT,PERRAL Supple Neck,No JVD, No cervical lymphadenopathy appriciated.  Symmetrical Chest wall movement, Good air movement bilaterally, CTAB RRR,No Gallops,Rubs or new Murmurs, No Parasternal Heave +ve B.Sounds, Abd Soft, Non tender, No organomegaly appriciated, No rebound -guarding or rigidity. No Cyanosis, Clubbing or edema, No new Rash or bruise   Data Review   CBC w Diff:  Lab Results  Component Value Date   WBC 12.8 (H) 03/30/2020   HGB 15.2 03/30/2020   HCT 45.5 03/30/2020   PLT 394 03/30/2020   LYMPHOPCT 6 03/26/2020   MONOPCT 3 03/26/2020   EOSPCT 0 03/26/2020   BASOPCT 1 03/26/2020    CMP:  Lab Results  Component Value Date   NA 135 03/30/2020   K 4.8 03/30/2020   CL 102 03/30/2020   CO2 25 03/30/2020   BUN 21 (H) 03/30/2020   CREATININE 0.96 03/30/2020   PROT 5.7 (L) 03/30/2020   ALBUMIN 2.4 (L) 03/30/2020   BILITOT 0.5 03/30/2020   ALKPHOS 97 03/30/2020   AST 37 03/30/2020   ALT 109 (H) 03/30/2020  .   Total Time in preparing paper work, data  evaluation and todays exam - 27 minutes  Lala Lund M.D on 03/30/2020 at 9:40 AM  [place]  [phone]

## 2020-03-30 NOTE — Plan of Care (Signed)
  Problem: Respiratory: Goal: Will maintain a patent airway Outcome: Progressing Goal: Complications related to the disease process, condition or treatment will be avoided or minimized Outcome: Progressing   

## 2020-03-30 NOTE — TOC Transition Note (Signed)
Transition of Care [facility]) - CM/SW Discharge Note   Patient Details  Name: Matthew Mack MRN: [id number] Date of Birth: [birth year]  Transition of Care [facility]) CM/SW Contact:  Lawerance Sabal, RN Phone Number: 03/30/2020, 9:57 AM   Clinical Narrative:    Could not reach patient. Orders for home oxygen. Will DC today Referral placed to [employer]. They will bring O2 for transport to room, and set up concentrator at house.    Final next level of care: Home/Self Care Barriers to Discharge: No Barriers Identified   Patient Goals and CMS Choice        Discharge Placement                       Discharge Plan and Services                DME Arranged: Oxygen DME Agency: [employer] Date DME Agency Contacted: 03/30/20 Time DME Agency Contacted: [phone] Representative spoke with at DME Agency: [place]            Social Determinants of Health (SDOH) Interventions     Readmission Risk Interventions No flowsheet data found.

## 2020-03-30 NOTE — Discharge Instructions (Signed)
Follow with Primary MD Aliene Beams, MD in 7 days   Get CBC, CMP, 2 view Chest X ray -  checked next visit within 1 week by Primary MD   Activity: As tolerated with Full fall precautions use walker/cane & assistance as needed  Disposition Home    Diet: Heart Healthy     Special Instructions: If you have smoked or chewed Tobacco  in the last 2 yrs please stop smoking, stop any regular Alcohol  and or any Recreational drug use.  On your next visit with your primary care physician please Get Medicines reviewed and adjusted.  Please request your Prim.MD to go over all Hospital Tests and Procedure/Radiological results at the follow up, please get all Hospital records sent to your Prim MD by signing hospital release before you go home.  If you experience worsening of your admission symptoms, develop shortness of breath, life threatening emergency, suicidal or homicidal thoughts you must seek medical attention immediately by calling 911 or calling your MD immediately  if symptoms less severe.  You Must read complete instructions/literature along with all the possible adverse reactions/side effects for all the Medicines you take and that have been prescribed to you. Take any new Medicines after you have completely understood and accpet all the possible adverse reactions/side effects.           Person Under Monitoring Name: Matthew Mack  Location: [address] [place] [address]   Infection Prevention Recommendations for Individuals Confirmed to have, or Being Evaluated for, 2019 Novel Coronavirus (COVID-19) Infection Who Receive Care at Home  Individuals who are confirmed to have, or are being evaluated for, COVID-19 should follow the prevention steps below until a healthcare provider or local or state health department says they can return to normal activities.  Stay home except to get medical care You should restrict activities outside your home, except for getting  medical care. Do not go to work, school, or public areas, and do not use public transportation or taxis.  Call ahead before visiting your doctor Before your medical appointment, call the healthcare provider and tell them that you have, or are being evaluated for, COVID-19 infection. This will help the healthcare provider's office take steps to keep other people from getting infected. Ask your healthcare provider to call the local or state health department.  Monitor your symptoms Seek prompt medical attention if your illness is worsening (e.g., difficulty breathing). Before going to your medical appointment, call the healthcare provider and tell them that you have, or are being evaluated for, COVID-19 infection. Ask your healthcare provider to call the local or state health department.  Wear a facemask You should wear a facemask that covers your nose and mouth when you are in the same room with other people and when you visit a healthcare provider. People who live with or visit you should also wear a facemask while they are in the same room with you.  Separate yourself from other people in your home As much as possible, you should stay in a different room from other people in your home. Also, you should use a separate bathroom, if available.  Avoid sharing household items You should not share dishes, drinking glasses, cups, eating utensils, towels, bedding, or other items with other people in your home. After using these items, you should wash them thoroughly with soap and water.  Cover your coughs and sneezes Cover your mouth and nose with a tissue when you cough or sneeze, or you  can cough or sneeze into your sleeve. Throw used tissues in a lined trash can, and immediately wash your hands with soap and water for at least 20 seconds or use an alcohol-based hand rub.  Wash your [facility] your hands often and thoroughly with soap and water for at least 20 seconds. You can use an  alcohol-based hand sanitizer if soap and water are not available and if your hands are not visibly dirty. Avoid touching your eyes, nose, and mouth with unwashed hands.   Prevention Steps for Caregivers and Household Members of Individuals Confirmed to have, or Being Evaluated for, COVID-19 Infection Being Cared for in the Home  If you live with, or provide care at home for, a person confirmed to have, or being evaluated for, COVID-19 infection please follow these guidelines to prevent infection:  Follow healthcare provider's instructions Make sure that you understand and can help the patient follow any healthcare provider instructions for all care.  Provide for the patient's basic needs You should help the patient with basic needs in the home and provide support for getting groceries, prescriptions, and other personal needs.  Monitor the patient's symptoms If they are getting sicker, call his or her medical provider and tell them that the patient has, or is being evaluated for, COVID-19 infection. This will help the healthcare provider's office take steps to keep other people from getting infected. Ask the healthcare provider to call the local or state health department.  Limit the number of people who have contact with the patient  If possible, have only one caregiver for the patient.  Other household members should stay in another home or place of residence. If this is not possible, they should stay  in another room, or be separated from the patient as much as possible. Use a separate bathroom, if available.  Restrict visitors who do not have an essential need to be in the home.  Keep older adults, very young children, and other sick people away from the patient Keep older adults, very young children, and those who have compromised immune systems or chronic health conditions away from the patient. This includes people with chronic heart, lung, or kidney conditions, diabetes, and  cancer.  Ensure good ventilation Make sure that shared spaces in the home have good air flow, such as from an air conditioner or an opened window, weather permitting.  Wash your hands often  Wash your hands often and thoroughly with soap and water for at least 20 seconds. You can use an alcohol based hand sanitizer if soap and water are not available and if your hands are not visibly dirty.  Avoid touching your eyes, nose, and mouth with unwashed hands.  Use disposable paper towels to dry your hands. If not available, use dedicated cloth towels and replace them when they become wet.  Wear a facemask and gloves  Wear a disposable facemask at all times in the room and gloves when you touch or have contact with the patient's blood, body fluids, and/or secretions or excretions, such as sweat, saliva, sputum, nasal mucus, vomit, urine, or feces.  Ensure the mask fits over your nose and mouth tightly, and do not touch it during use.  Throw out disposable facemasks and gloves after using them. Do not reuse.  Wash your hands immediately after removing your facemask and gloves.  If your personal clothing becomes contaminated, carefully remove clothing and launder. Wash your hands after handling contaminated clothing.  Place all used disposable facemasks, gloves,  and other waste in a lined container before disposing them with other household waste.  Remove gloves and wash your hands immediately after handling these items.  Do not share dishes, glasses, or other household items with the patient  Avoid sharing household items. You should not share dishes, drinking glasses, cups, eating utensils, towels, bedding, or other items with a patient who is confirmed to have, or being evaluated for, COVID-19 infection.  After the person uses these items, you should wash them thoroughly with soap and water.  Wash laundry thoroughly  Immediately remove and wash clothes or bedding that have blood, body  fluids, and/or secretions or excretions, such as sweat, saliva, sputum, nasal mucus, vomit, urine, or feces, on them.  Wear gloves when handling laundry from the patient.  Read and follow directions on labels of laundry or clothing items and detergent. In general, wash and dry with the warmest temperatures recommended on the label.  Clean all areas the individual has used often  Clean all touchable surfaces, such as counters, tabletops, doorknobs, bathroom fixtures, toilets, phones, keyboards, tablets, and bedside tables, every day. Also, clean any surfaces that may have blood, body fluids, and/or secretions or excretions on them.  Wear gloves when cleaning surfaces the patient has come in contact with.  Use a diluted bleach solution (e.g., dilute bleach with 1 part bleach and 10 parts water) or a household disinfectant with a label that says EPA-registered for coronaviruses. To make a bleach solution at home, add 1 tablespoon of bleach to 1 quart (4 cups) of water. For a larger supply, add  cup of bleach to 1 gallon (16 cups) of water.  Read labels of cleaning products and follow recommendations provided on product labels. Labels contain instructions for safe and effective use of the cleaning product including precautions you should take when applying the product, such as wearing gloves or eye protection and making sure you have good ventilation during use of the product.  Remove gloves and wash hands immediately after cleaning.  Monitor yourself for signs and symptoms of illness Caregivers and household members are considered close contacts, should monitor their health, and will be asked to limit movement outside of the home to the extent possible. Follow the monitoring steps for close contacts listed on the symptom monitoring form.   ? If you have additional questions, contact your local health department or call the epidemiologist on call at [phone] (available 24/7). ? This  guidance is subject to change. For the most up-to-date guidance from [facility], please refer to their website: [email or web address]

## 2020-04-01 DIAGNOSIS — U071 COVID-19: Secondary | ICD-10-CM | POA: Diagnosis not present

## 2020-04-03 DIAGNOSIS — U071 COVID-19: Secondary | ICD-10-CM | POA: Diagnosis not present

## 2020-04-03 DIAGNOSIS — R7401 Elevation of levels of liver transaminase levels: Secondary | ICD-10-CM | POA: Diagnosis not present

## 2020-04-03 DIAGNOSIS — Z9289 Personal history of other medical treatment: Secondary | ICD-10-CM | POA: Diagnosis not present

## 2020-04-03 DIAGNOSIS — J1282 Pneumonia due to coronavirus disease 2019: Secondary | ICD-10-CM | POA: Diagnosis not present

## 2020-04-15 ENCOUNTER — Ambulatory Visit
Admission: RE | Admit: 2020-04-15 | Discharge: 2020-04-15 | Disposition: A | Discharge status: Home / Self Care | Payer: BC Managed Care – PPO | Source: Ambulatory Visit | Attending: Family Medicine | Admitting: Family Medicine

## 2020-04-15 ENCOUNTER — Other Ambulatory Visit: Payer: Self-pay | Admitting: Family Medicine

## 2020-04-15 DIAGNOSIS — J1282 Pneumonia due to coronavirus disease 2019: Secondary | ICD-10-CM

## 2020-04-15 DIAGNOSIS — U071 COVID-19: Secondary | ICD-10-CM

## 2020-04-16 DIAGNOSIS — J1282 Pneumonia due to coronavirus disease 2019: Secondary | ICD-10-CM | POA: Diagnosis not present

## 2020-04-16 DIAGNOSIS — R7401 Elevation of levels of liver transaminase levels: Secondary | ICD-10-CM | POA: Diagnosis not present

## 2020-04-16 DIAGNOSIS — E782 Mixed hyperlipidemia: Secondary | ICD-10-CM | POA: Diagnosis not present

## 2020-04-16 DIAGNOSIS — U071 COVID-19: Secondary | ICD-10-CM | POA: Diagnosis not present

## 2020-07-18 DIAGNOSIS — R0789 Other chest pain: Secondary | ICD-10-CM | POA: Diagnosis not present

## 2020-07-18 DIAGNOSIS — Z0000 Encounter for general adult medical examination without abnormal findings: Secondary | ICD-10-CM | POA: Diagnosis not present

## 2020-07-18 DIAGNOSIS — F419 Anxiety disorder, unspecified: Secondary | ICD-10-CM | POA: Diagnosis not present

## 2020-07-18 DIAGNOSIS — E669 Obesity, unspecified: Secondary | ICD-10-CM | POA: Diagnosis not present

## 2020-07-18 DIAGNOSIS — Z125 Encounter for screening for malignant neoplasm of prostate: Secondary | ICD-10-CM | POA: Diagnosis not present

## 2020-07-18 DIAGNOSIS — E782 Mixed hyperlipidemia: Secondary | ICD-10-CM | POA: Diagnosis not present

## 2020-08-05 ENCOUNTER — Encounter: Payer: Self-pay | Admitting: Physical Medicine and Rehabilitation

## 2020-08-22 ENCOUNTER — Encounter
Payer: BC Managed Care – PPO | Attending: Physical Medicine and Rehabilitation | Admitting: Physical Medicine and Rehabilitation

## 2020-08-22 ENCOUNTER — Ambulatory Visit
Admission: RE | Admit: 2020-08-22 | Discharge: 2020-08-22 | Disposition: A | Discharge status: Home / Self Care | Payer: BC Managed Care – PPO | Source: Ambulatory Visit | Attending: Physical Medicine and Rehabilitation | Admitting: Physical Medicine and Rehabilitation

## 2020-08-22 ENCOUNTER — Other Ambulatory Visit: Payer: Self-pay

## 2020-08-22 ENCOUNTER — Encounter: Payer: Self-pay | Admitting: Physical Medicine and Rehabilitation

## 2020-08-22 VITALS — BP 138/96 | HR 72 | Temp 97.7°F | Ht 69.0 in | Wt 219.0 lb

## 2020-08-22 DIAGNOSIS — M542 Cervicalgia: Secondary | ICD-10-CM

## 2020-08-22 DIAGNOSIS — M898X1 Other specified disorders of bone, shoulder: Secondary | ICD-10-CM | POA: Diagnosis not present

## 2020-08-22 DIAGNOSIS — M7918 Myalgia, other site: Secondary | ICD-10-CM

## 2020-08-22 MED ORDER — CYCLOBENZAPRINE HCL 10 MG PO TABS: 10.0000 mg | ORAL_TABLET | Freq: Three times a day (TID) | ORAL | 3 refills | Status: AC | PRN
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
## 2020-09-24 ENCOUNTER — Encounter
Payer: BC Managed Care – PPO | Attending: Physical Medicine and Rehabilitation | Admitting: Physical Medicine and Rehabilitation

## 2020-09-24 ENCOUNTER — Other Ambulatory Visit: Payer: Self-pay

## 2020-09-24 ENCOUNTER — Encounter: Payer: Self-pay | Admitting: Physical Medicine and Rehabilitation

## 2020-09-24 VITALS — BP 134/86 | HR 74 | Temp 97.6°F | Ht 70.0 in | Wt 227.0 lb

## 2020-09-24 DIAGNOSIS — M542 Cervicalgia: Secondary | ICD-10-CM | POA: Diagnosis not present

## 2020-09-24 DIAGNOSIS — M898X1 Other specified disorders of bone, shoulder: Secondary | ICD-10-CM | POA: Insufficient documentation

## 2020-09-24 DIAGNOSIS — M7918 Myalgia, other site: Secondary | ICD-10-CM | POA: Diagnosis not present

## 2020-09-24 MED ORDER — TIZANIDINE HCL 4 MG PO TABS: 2.0000 mg | ORAL_TABLET | Freq: Two times a day (BID) | ORAL | 5 refills | Status: DC | PRN
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
## 2020-09-24 NOTE — Patient Instructions (Addendum)
1. Patient here for trigger point injections for  Consent done and on chart.  Cleaned areas with alcohol and injected using a 27 gauge 1.5 inch needle  Injected 6cc Using 1% Lidocaine with no EPI  Upper traps B/L Levators L only Posterior scalenes B/L Middle scalenes Splenius Capitus L only x 2 Pectoralis Major B/L  Rhomboids L x2 Infraspinatus Teres Major/minor Thoracic paraspinals Lumbar paraspinals Other injections-    Patient's level of pain prior was  8-9/10- due to HA Current level of pain after injections is- 8-9/10  There was no bleeding or complications.  Patient was advised to drink a lot of water on day after injections to flush system Will have increased soreness for 12-48 hours after injections.  Can use Lidocaine patches the day AFTER injections Can use theracane on day of injections in places didn't inject Can use heating pad 4-6 hours AFTER injections  2. No caffeine today-  And reduce caffeine overall.   3. Continue theracane - remember the water/and less caffeine.   4. Can use lidoderm patches- can use up to 3 patches- but start with 1- 12 on;12 hrs off- Suggest night time initially, because don't move around as much.    5. F/U 6 weeks.  Trigger point injections if needed at next visit.   6. Tizanidine 2-4 mg up to 2x/day as needed- - stop Cyclobenzaprine.

## 2020-11-07 ENCOUNTER — Encounter: Payer: Self-pay | Admitting: Physical Medicine and Rehabilitation

## 2020-11-07 ENCOUNTER — Other Ambulatory Visit: Payer: Self-pay

## 2020-11-07 ENCOUNTER — Encounter
Payer: BC Managed Care – PPO | Attending: Physical Medicine and Rehabilitation | Admitting: Physical Medicine and Rehabilitation

## 2020-11-07 VITALS — BP 143/88 | HR 72 | Temp 98.1°F | Ht 70.0 in | Wt 225.4 lb

## 2020-11-07 DIAGNOSIS — M542 Cervicalgia: Secondary | ICD-10-CM

## 2020-11-07 DIAGNOSIS — M898X1 Other specified disorders of bone, shoulder: Secondary | ICD-10-CM | POA: Insufficient documentation

## 2020-11-07 DIAGNOSIS — M7918 Myalgia, other site: Secondary | ICD-10-CM | POA: Diagnosis not present

## 2020-11-07 MED ORDER — TIZANIDINE HCL 4 MG PO TABS: 4.0000 mg | ORAL_TABLET | Freq: Two times a day (BID) | ORAL | 5 refills | Status: DC | PRN

## 2020-11-07 NOTE — Patient Instructions (Signed)
Plan: 1. Patient here for trigger point injections for  Consent done and on chart.  Cleaned areas with alcohol and injected using a 27 gauge 1.5 inch needle  Injected 6cc  Using 1% Lidocaine with no EPI  Upper traps B/L Levators B/L Posterior scalenes B/L x2 Middle scalenes Splenius Capitus B/L Pectoralis Major B/L Rhomboids B/L Infraspinatus L only Teres Major/minor Thoracic paraspinals Lumbar paraspinals Other injections- B/L supraspinatus    Patient's level of pain prior was 7-8/10 Current level of pain after injections is 7-8/10  There was no bleeding or complications.  Patient was advised to drink a lot of water on day after injections to flush system Will have increased soreness for 12-48 hours after injections.  Can use Lidocaine patches the day AFTER injections Can use theracane on day of injections in places didn't inject Can use heating pad 4-6 hours AFTER injections  2. Continue myofascial work- theracane  3. Increase Zanaflex/tizanidine to 4-8 mg 1-2 tabs 2x/day as needed for muscle relaxation.   4. F/U in 6-8 weeks for trigger point injections.
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
## 2020-12-26 ENCOUNTER — Encounter: Payer: BC Managed Care – PPO | Admitting: Physical Medicine and Rehabilitation

## 2021-05-15 DIAGNOSIS — R519 Headache, unspecified: Secondary | ICD-10-CM | POA: Diagnosis not present

## 2021-05-15 DIAGNOSIS — S161XXA Strain of muscle, fascia and tendon at neck level, initial encounter: Secondary | ICD-10-CM | POA: Diagnosis not present

## 2021-05-15 DIAGNOSIS — M549 Dorsalgia, unspecified: Secondary | ICD-10-CM | POA: Diagnosis not present

## 2021-05-20 DIAGNOSIS — M5412 Radiculopathy, cervical region: Secondary | ICD-10-CM | POA: Diagnosis not present

## 2021-05-20 DIAGNOSIS — M542 Cervicalgia: Secondary | ICD-10-CM | POA: Diagnosis not present

## 2021-05-26 ENCOUNTER — Other Ambulatory Visit: Payer: Self-pay | Admitting: Physical Medicine and Rehabilitation

## 2021-05-28 DIAGNOSIS — Z20822 Contact with and (suspected) exposure to covid-19: Secondary | ICD-10-CM | POA: Diagnosis not present

## 2021-06-26 DIAGNOSIS — M542 Cervicalgia: Secondary | ICD-10-CM | POA: Diagnosis not present

## 2021-06-26 DIAGNOSIS — M5412 Radiculopathy, cervical region: Secondary | ICD-10-CM | POA: Diagnosis not present

## 2021-07-02 DIAGNOSIS — M791 Myalgia, unspecified site: Secondary | ICD-10-CM | POA: Diagnosis not present

## 2021-07-02 DIAGNOSIS — M5412 Radiculopathy, cervical region: Secondary | ICD-10-CM | POA: Diagnosis not present

## 2021-07-02 DIAGNOSIS — M79622 Pain in left upper arm: Secondary | ICD-10-CM | POA: Diagnosis not present

## 2021-07-02 DIAGNOSIS — M2560 Stiffness of unspecified joint, not elsewhere classified: Secondary | ICD-10-CM | POA: Diagnosis not present

## 2021-07-21 DIAGNOSIS — R030 Elevated blood-pressure reading, without diagnosis of hypertension: Secondary | ICD-10-CM | POA: Diagnosis not present

## 2021-07-21 DIAGNOSIS — Z0000 Encounter for general adult medical examination without abnormal findings: Secondary | ICD-10-CM | POA: Diagnosis not present

## 2021-07-21 DIAGNOSIS — R002 Palpitations: Secondary | ICD-10-CM | POA: Diagnosis not present

## 2021-07-21 DIAGNOSIS — F419 Anxiety disorder, unspecified: Secondary | ICD-10-CM | POA: Diagnosis not present

## 2021-07-21 DIAGNOSIS — E782 Mixed hyperlipidemia: Secondary | ICD-10-CM | POA: Diagnosis not present

## 2021-08-21 ENCOUNTER — Other Ambulatory Visit: Payer: Self-pay | Admitting: Physical Medicine and Rehabilitation

## 2021-08-21 NOTE — Telephone Encounter (Signed)
Denying Tizanidine, last seen 11/07/20, cancelled 12/26/20 appt. Called to see if patient plan on returning to this office and no answer. Patient can get a refill if make an appt.

## 2021-08-31 NOTE — Progress Notes (Signed)
CARDIOLOGY CONSULT NOTE       Patient ID: Matthew Mack MRN: [id number] DOB/AGE: Jun 08, 1966 55 y.o.  Admit date: (Not on file) Referring Physician: Hagler Primary Physician: Aliene Beams, MD Primary Cardiologist: New Reason for Consultation: Palpitations/Dyspnea  Active Problems:   * No active hospital problems. *   HPI:  55 y.o. referred by Dr Tracie Harrier for palpitations and dyspnea History of obesity, HLD and diverticulitis post sigmoid colectomay He takes a lot of vitamins including Ashwagandha   He is also on Zoloft for anxiety/depression He was hospitalized with COVID 03/30/20  and RX with oxygen IV remdesivir and steroids CXR showed patchy multifocal airspace opacities consistent with atypical pneumonia  He really has not had any acute symptoms For over 20 years notices his heart skip from time to time Only seconds nothing sustained. Has been taking supplements for decades and not related.  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[place] [employer] Has 16 yo daughter at home and 35 yo son who works at [employer] and plays too many video games   ROS All other systems reviewed and negative except as noted above  Past Medical History:  Diagnosis Date   Diverticulitis    Hyperlipidemia    Obesity     Family History  Problem Relation Age of Onset   Diabetes Mother     Social History   Socioeconomic History   Marital status: Single    Spouse name: Not on file   Number of children: Not on file   Years of education: Not on file   Highest education level: Not on file  Occupational History   Not on file  Tobacco Use   Smoking status: Never   Smokeless tobacco: Never  Vaping Use   Vaping Use: Never used  Substance and Sexual Activity   Alcohol use: Not Currently   Drug use: Never   Sexual activity: Not on file  Other Topics Concern   Not on file  Social History Narrative   Not on file   Social Determinants of  Health   Financial Resource Strain: Not on file  Food Insecurity: Not on file  Transportation Needs: Not on file  Physical Activity: Not on file  Stress: Not on file  Social Connections: Not on file  Intimate Partner Violence: Not on file    Past Surgical History:  Procedure Laterality Date   HERNIA REPAIR     LAPAROSCOPIC SIGMOID COLECTOMY N/A 01/17/2020   Procedure: LAPAROSCOPIC ASSISTED SIGMOID COLECTOMY;  Surgeon: Griselda Miner, MD;  Location: [facility] OR;  Service: General;  Laterality: N/A;      Current Outpatient Medications:    ASHWAGANDHA PO, Take by mouth., Disp: , Rfl:    atorvastatin (LIPITOR) 40 MG tablet, Take 40 mg by mouth at bedtime., Disp: , Rfl:    Cyanocobalamin (VITAMIN B-12 PO), Take 1 tablet by mouth daily., Disp: , Rfl:    cyclobenzaprine (FLEXERIL) 10 MG tablet, Take 1 tablet (10 mg total) by mouth 3 (three) times daily as needed for muscle spasms., Disp: 90 tablet, Rfl: 3   Multiple Vitamin (MULTIVITAMIN ADULT PO), Take 1 tablet by mouth daily., Disp: , Rfl:    Multiple Vitamins-Minerals (CENTRUM MEN PO), Take by mouth., Disp: , Rfl:    Omega-3 Fatty Acids (FISH OIL PO), Take 1 capsule by mouth daily., Disp: , Rfl:    Probiotic Product (PROBIOTIC PO), Take by mouth., Disp: , Rfl:  Saw Palmetto, Serenoa repens, (SAW PALMETTO PO), Take by mouth., Disp: , Rfl:    sertraline (ZOLOFT) 25 MG tablet, Take 25 mg by mouth daily., Disp: , Rfl:    Thiamine HCl (VITAMIN B-1 PO), Take 1 tablet by mouth daily., Disp: , Rfl:    tiZANidine (ZANAFLEX) 4 MG tablet, TAKE 0.5-1 TABLETS (2-4 MG TOTAL) BY MOUTH 2 (TWO) TIMES DAILY AS NEEDED FOR MUSCLE SPASMS., Disp: 120 tablet, Rfl: 0   HYDROcodone-acetaminophen (NORCO/VICODIN) 5-325 MG tablet, Take 1-2 tablets by mouth every 6 (six) hours as needed for moderate pain. (Patient not taking: Reported on 09/09/2021), Disp: 15 tablet, Rfl: 0    Physical Exam: Blood pressure 122/82, pulse 86, height 5\' 9"  (1.753 m), weight 227 lb (103  kg), SpO2 97 %.    Affect appropriate Healthy:  appears stated age HEENT: normal Neck supple with no adenopathy JVP normal no bruits no thyromegaly Lungs clear with no wheezing and good diaphragmatic motion Heart:  S1/S2 no murmur, no rub, gallop or click PMI normal Abdomen: benighn,post sigmoid colectomy  Distal pulses intact with no bruits No edema Neuro non-focal Skin warm and dry No muscular weakness   Labs:   Lab Results  Component Value Date   WBC 12.8 (H) 03/30/2020   HGB 15.2 03/30/2020   HCT 45.5 03/30/2020   MCV 91.0 03/30/2020   PLT 394 03/30/2020   No results for input(s): NA, K, CL, CO2, BUN, CREATININE, CALCIUM, PROT, BILITOT, ALKPHOS, ALT, AST, GLUCOSE in the last 168 hours.  Invalid input(s): LABALBU No results found for: CKTOTAL, CKMB, CKMBINDEX, TROPONINI No results found for: CHOL No results found for: HDL No results found for: [facility] Lab Results  Component Value Date   TRIG 115 03/25/2020   No results found for: CHOLHDL No results found for: LDLDIRECT    Radiology: No results found.  EKG: 03/26/20 SR rate 100 normal 09/09/2021 SR rate 83 nonspecific T inversion lead 3 normal QT 348 normal intervals    ASSESSMENT AND PLAN:   Palpitations:  chronic benign not on stimulant supplements TTE to r/o structural heart dx Baseline ECG is normal and no acute symptoms  Dyspnea:  previous COVID June 2021 improved post covid normal lung exam Anxiety/Depression:  continue Zoloft HLD:  on statin labs with primary    TTE F/U PRN   Signed: July 2021 09/09/2021, 4:12 PM

## 2021-09-09 ENCOUNTER — Ambulatory Visit: Payer: BC Managed Care – PPO | Admitting: Cardiovascular Disease

## 2021-09-09 ENCOUNTER — Other Ambulatory Visit: Payer: Self-pay

## 2021-09-09 ENCOUNTER — Encounter: Payer: Self-pay | Admitting: Cardiovascular Disease

## 2021-09-09 VITALS — BP 122/82 | HR 86 | Ht 69.0 in | Wt 227.0 lb

## 2021-09-09 DIAGNOSIS — R0609 Other forms of dyspnea: Secondary | ICD-10-CM | POA: Diagnosis not present

## 2021-09-09 DIAGNOSIS — R002 Palpitations: Secondary | ICD-10-CM | POA: Diagnosis not present

## 2021-09-09 DIAGNOSIS — E782 Mixed hyperlipidemia: Secondary | ICD-10-CM | POA: Diagnosis not present

## 2021-09-09 NOTE — Patient Instructions (Signed)
Medication Instructions:  No changes *If you need a refill on your cardiac medications before your next appointment, please call your pharmacy*   Lab Work: NONE If you have labs (blood work) drawn today and your tests are completely normal, you will receive your results only by: MyChart Message (if you have MyChart) OR A paper copy in the mail If you have any lab test that is abnormal or we need to change your treatment, we will call you to review the results.   Testing/Procedures: Your physician has requested that you have an echocardiogram. Echocardiography is a painless test that uses sound waves to create images of your heart. It provides your doctor with information about the size and shape of your heart and how well your heart's chambers and valves are working. This procedure takes approximately one hour. There are no restrictions for this procedure.    Follow-Up: At [facility], you and your health needs are our priority.  As part of our continuing mission to provide you with exceptional heart care, we have created designated Provider Care Teams.  These Care Teams include your primary Cardiologist (physician) and Advanced Practice Providers (APPs -  Physician Assistants and Nurse Practitioners) who all work together to provide you with the care you need, when you need it.  We recommend signing up for the patient portal called "MyChart".  Sign up information is provided on this After Visit Summary.  MyChart is used to connect with patients for Virtual Visits (Telemedicine).  Patients are able to view lab/test results, encounter notes, upcoming appointments, etc.  Non-urgent messages can be sent to your provider as well.   To learn more about what you can do with MyChart, go to [email or web address].    Your next appointment:  AS NEEDED  The format for your next appointment:     Provider:       Other Instructions

## 2021-09-25 ENCOUNTER — Ambulatory Visit (HOSPITAL_COMMUNITY): Payer: BC Managed Care – PPO | Attending: Cardiovascular Disease

## 2021-09-25 ENCOUNTER — Other Ambulatory Visit: Payer: Self-pay

## 2021-09-25 DIAGNOSIS — R002 Palpitations: Secondary | ICD-10-CM | POA: Insufficient documentation

## 2021-09-25 DIAGNOSIS — I351 Nonrheumatic aortic (valve) insufficiency: Secondary | ICD-10-CM

## 2021-09-25 LAB — ECHOCARDIOGRAM COMPLETE
Area-P 1/2: 2.76 cm2
P 1/2 time: 556 msec
S' Lateral: 3.4 cm

## 2021-10-09 DIAGNOSIS — E782 Mixed hyperlipidemia: Secondary | ICD-10-CM | POA: Diagnosis not present

## 2021-10-27 DIAGNOSIS — R7401 Elevation of levels of liver transaminase levels: Secondary | ICD-10-CM | POA: Diagnosis not present

## 2021-10-27 DIAGNOSIS — E782 Mixed hyperlipidemia: Secondary | ICD-10-CM | POA: Diagnosis not present

## 2021-10-27 DIAGNOSIS — F419 Anxiety disorder, unspecified: Secondary | ICD-10-CM | POA: Diagnosis not present

## 2021-10-27 DIAGNOSIS — R7301 Impaired fasting glucose: Secondary | ICD-10-CM | POA: Diagnosis not present

## 2021-11-22 IMAGING — DX DG CHEST 2V
2 series · 2 of 2 positions shown · non-contrast
Comparison: 10/13/2009

CLINICAL DATA: Shortness of breath for several days

EXAM:
CHEST - 2 VIEW

[chest pa]
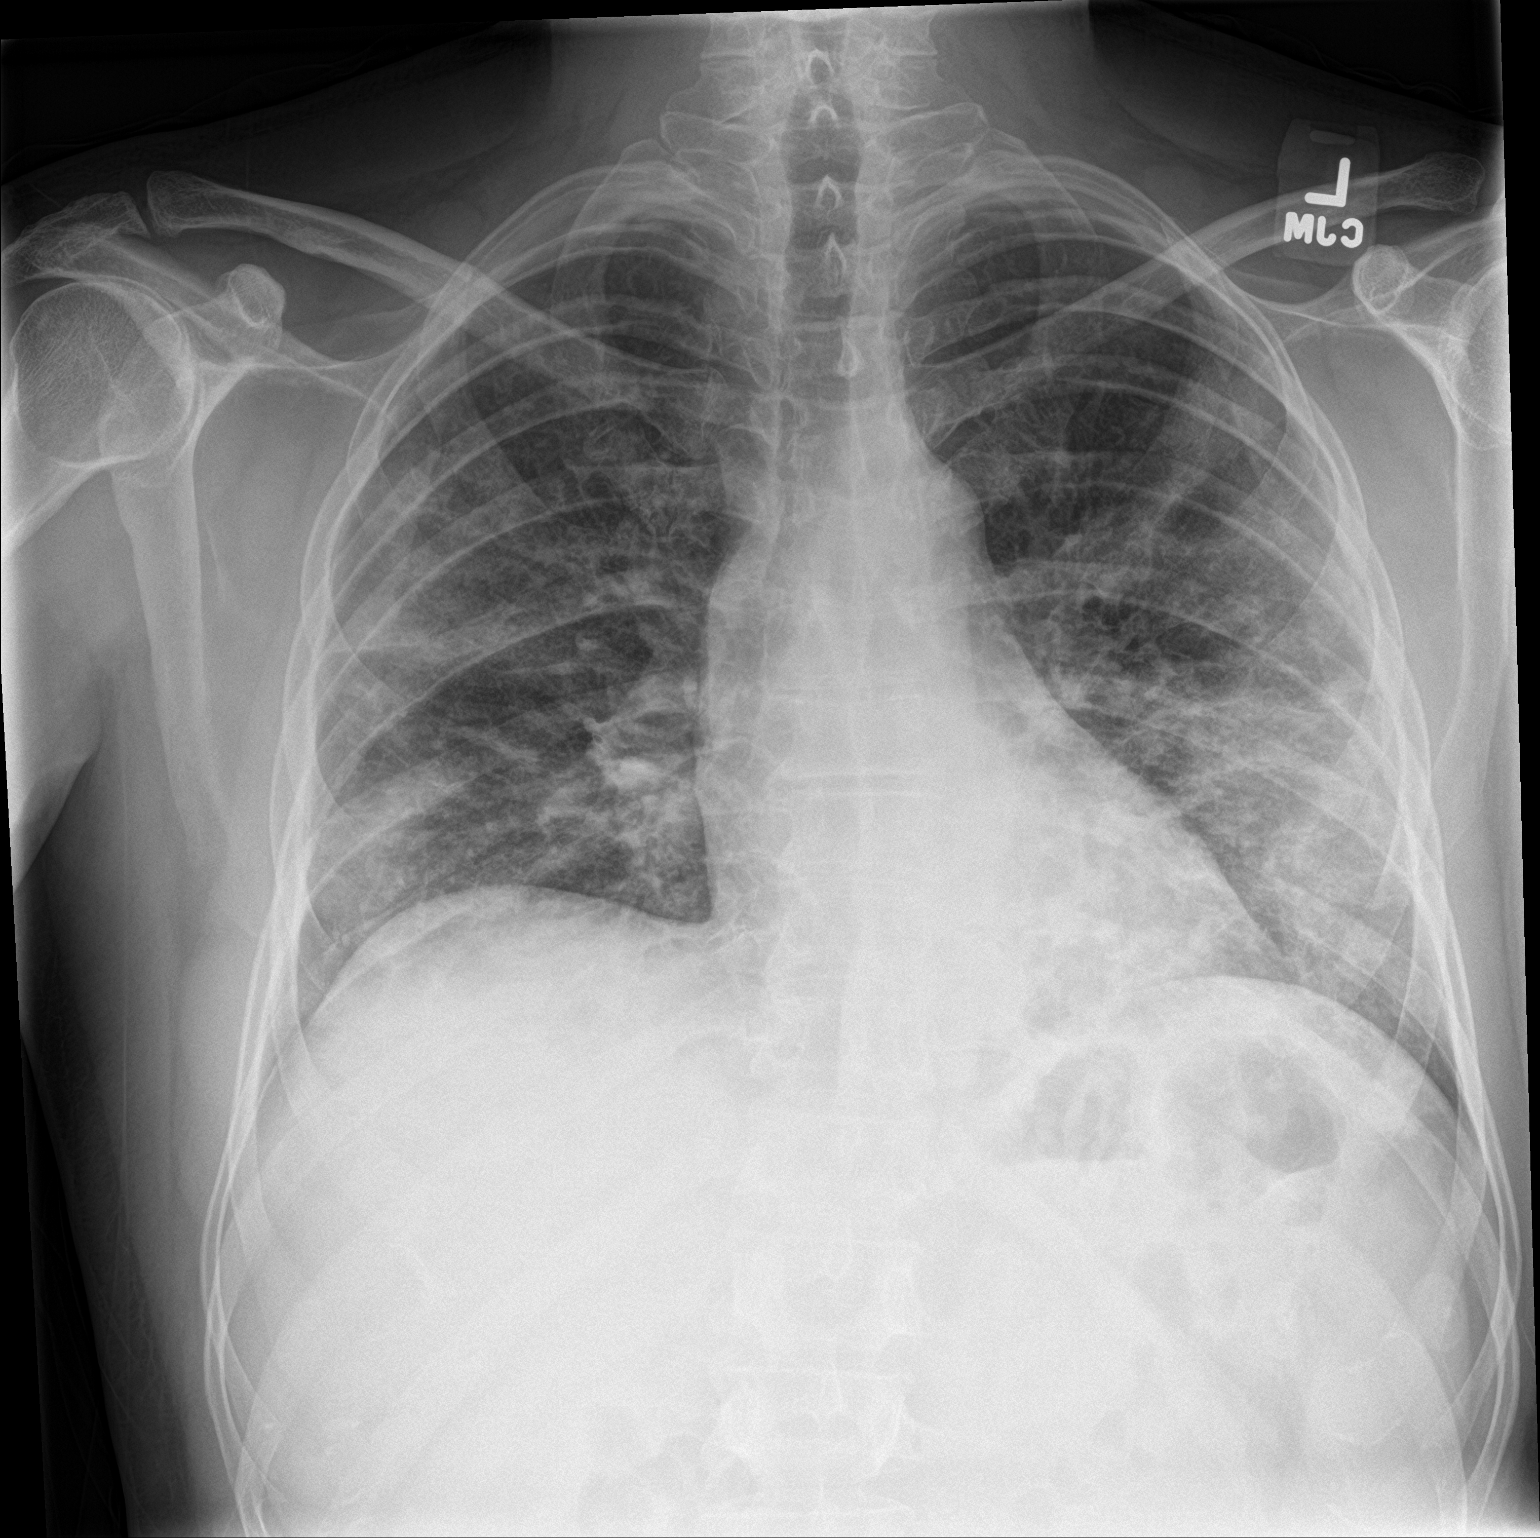

[chest lat]
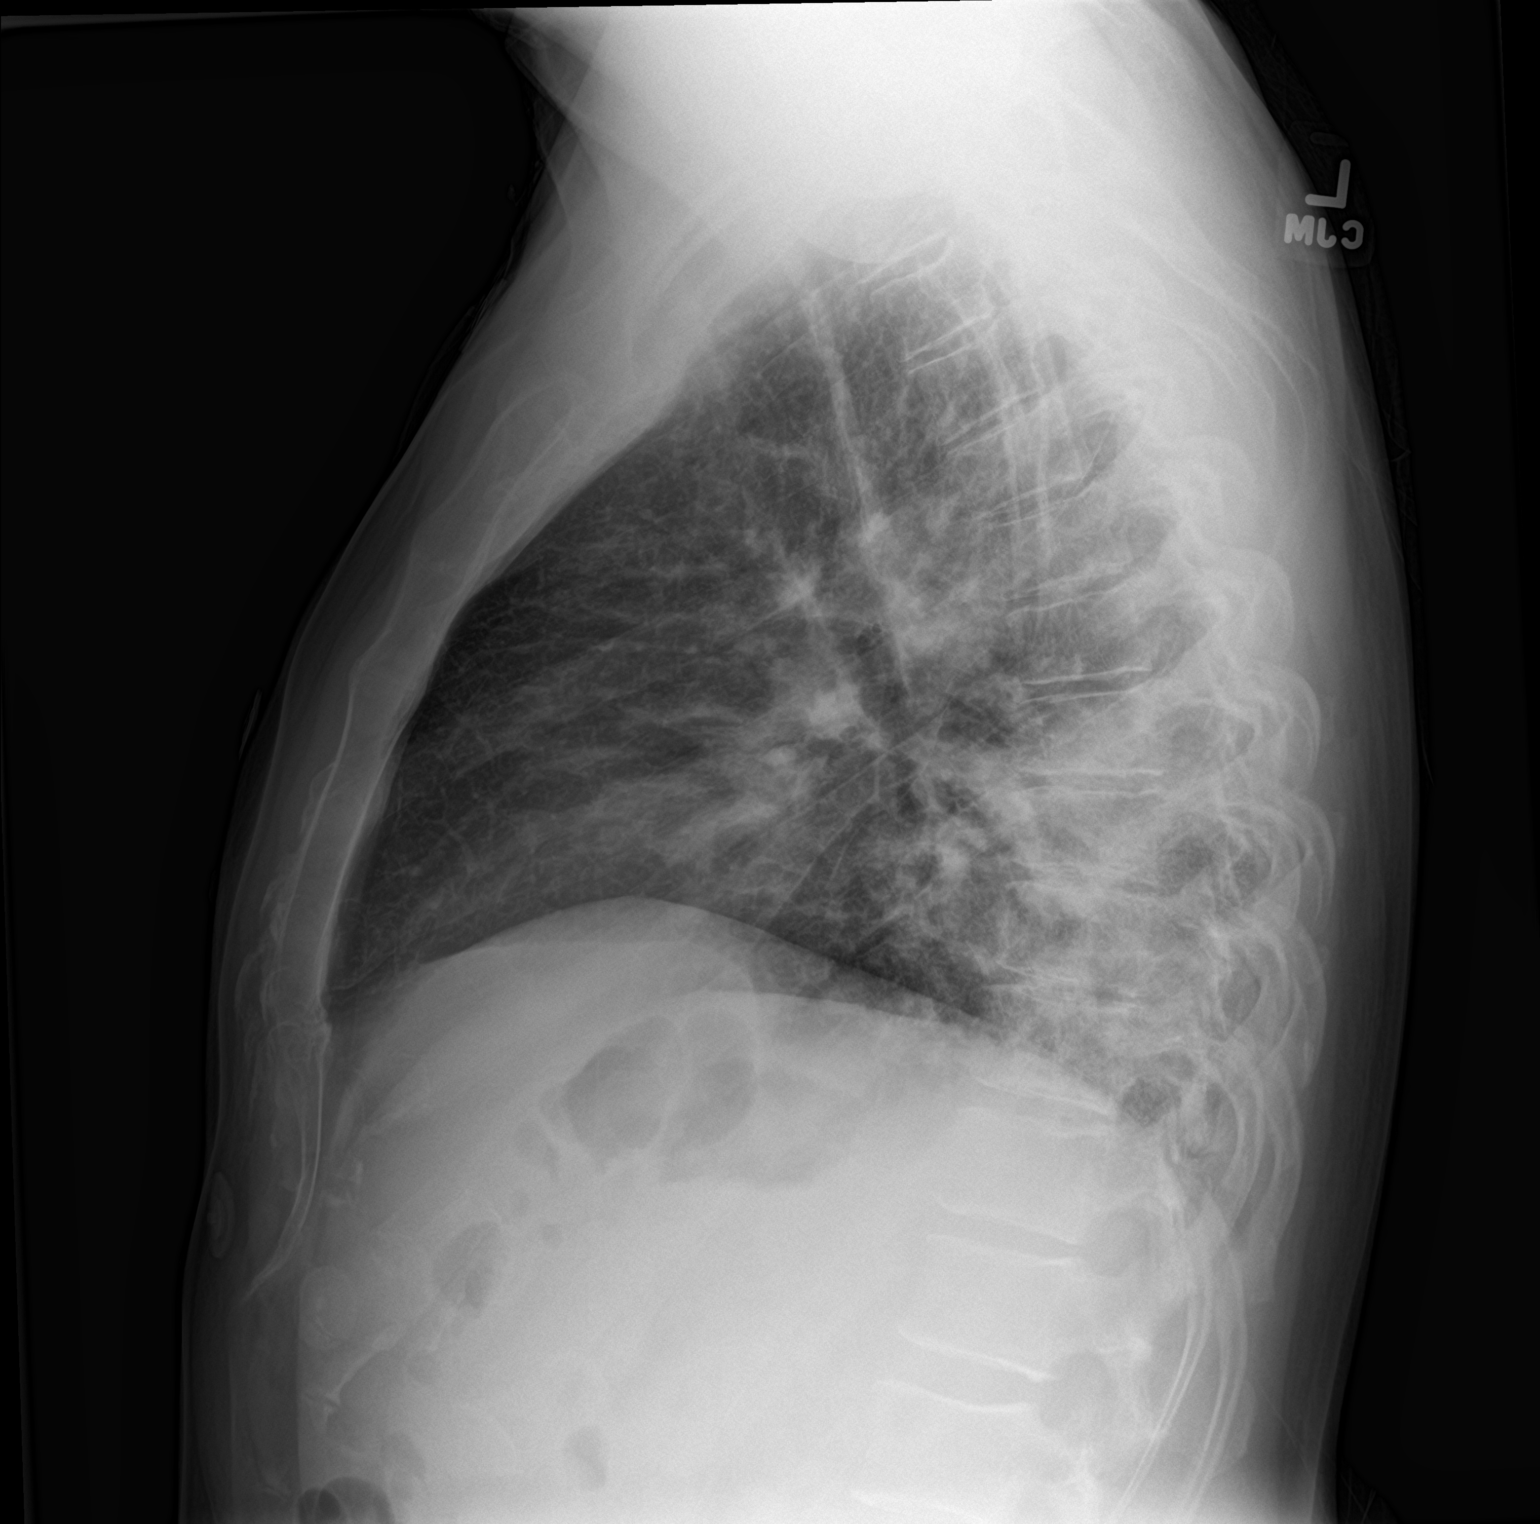

[2 of 2 positions shown; findings below may reference images not displayed]

FINDINGS: Cardiac shadow is stable. The lungs are well aerated bilaterally.
Patchy bilateral airspace opacities are noted particularly in the
lower lobe on the left. No sizable effusion is noted. No bony
abnormality is seen.
IMPRESSION: Patchy multifocal airspace opacities consistent with atypical
pneumonia.

## 2022-04-21 IMAGING — CR DG CERVICAL SPINE 2 OR 3 VIEWS
3 series · 3 of 3 positions shown · non-contrast
Comparison: None.

CLINICAL DATA: Neck pain for 20 years

EXAM:
CERVICAL SPINE - 3 VIEW

[w c-spine lat]
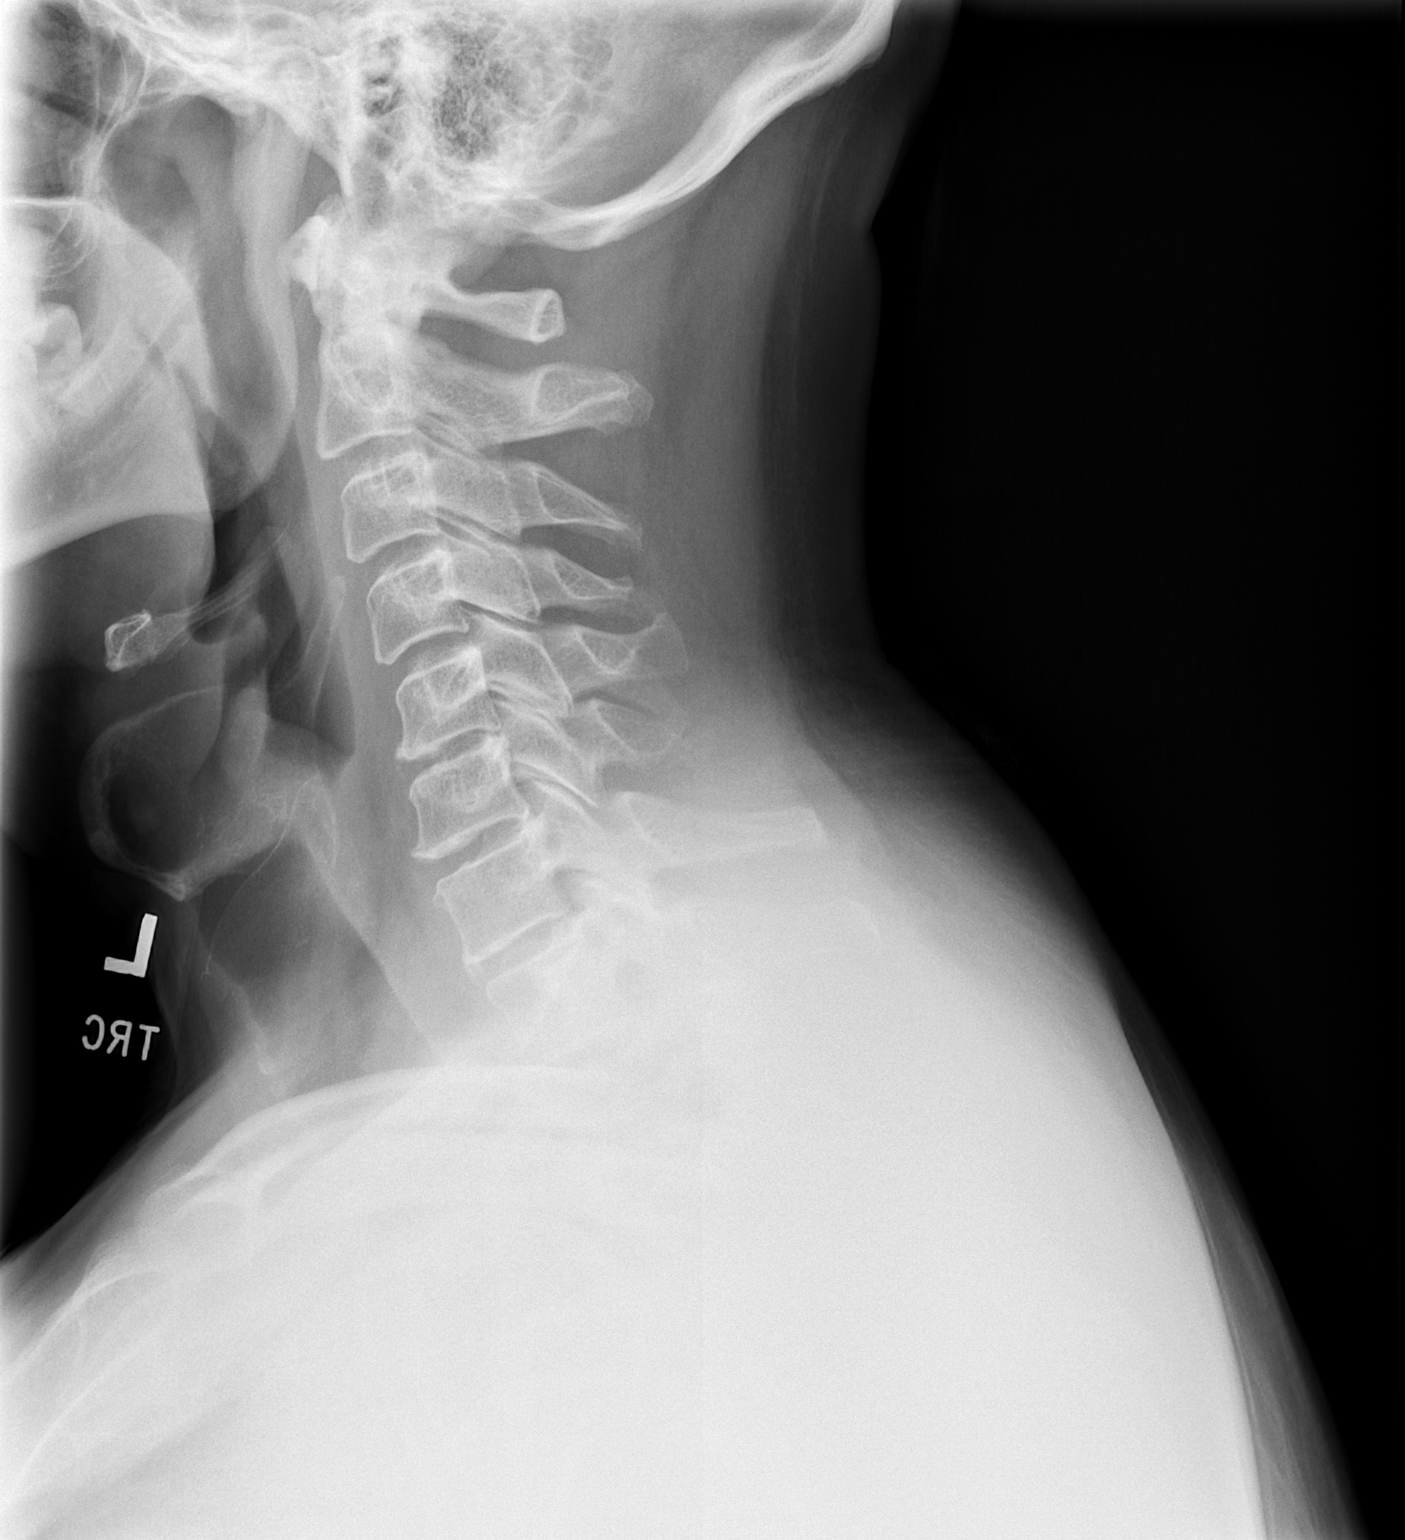

[w c-spine a.p. *]
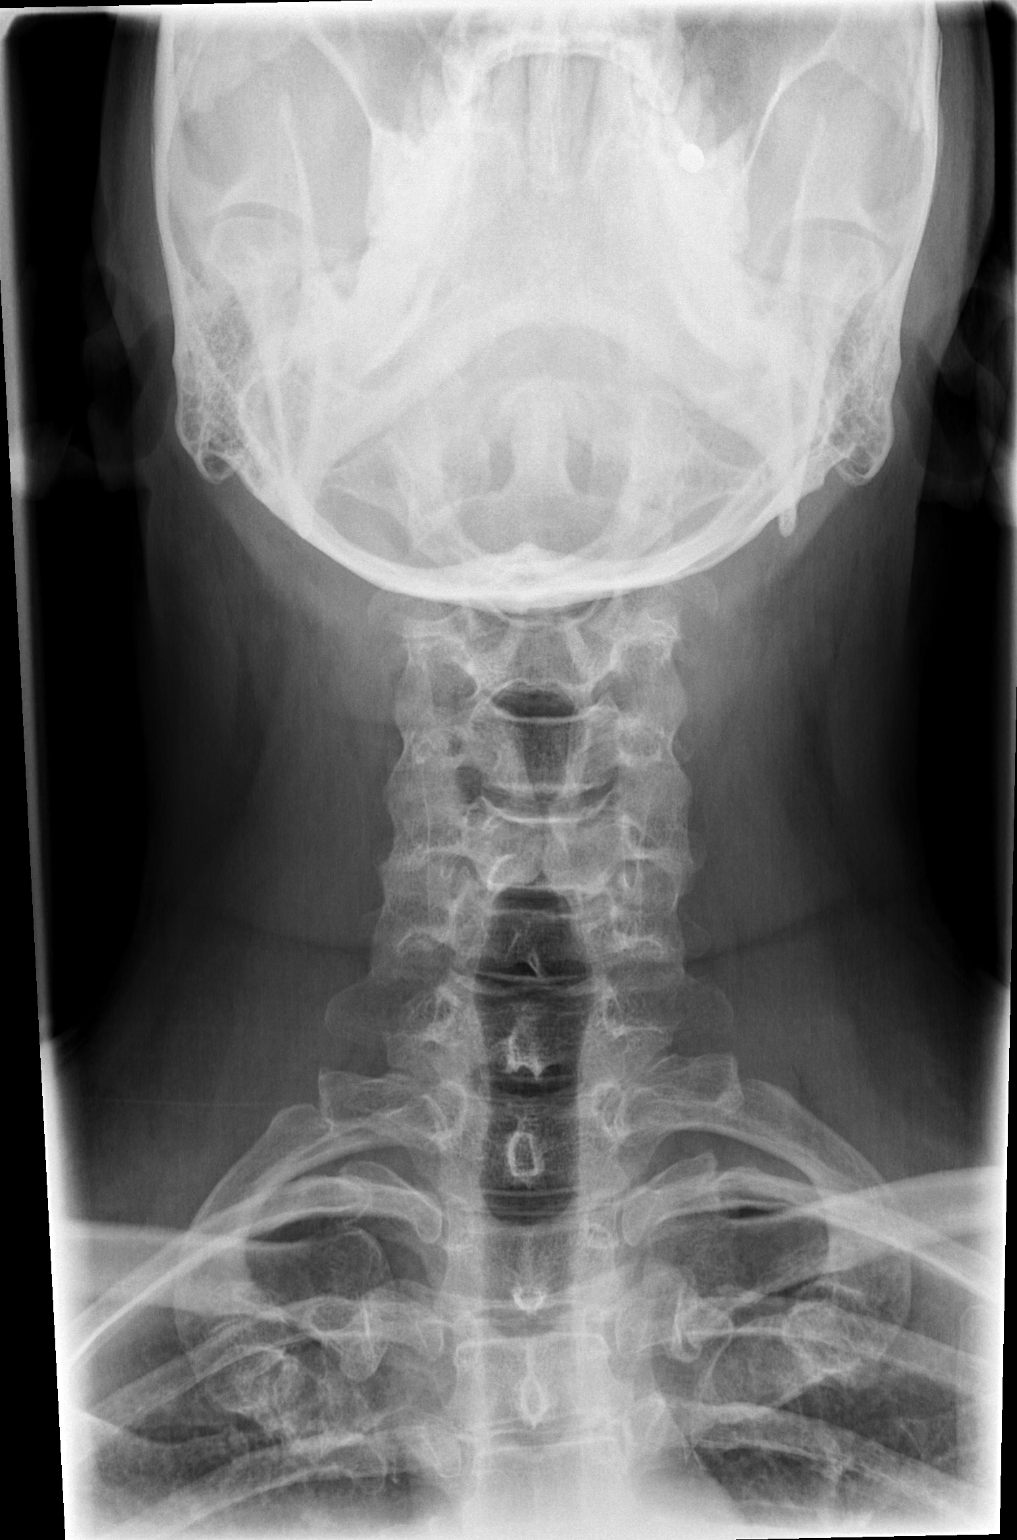

[w c-spine odontoid *]
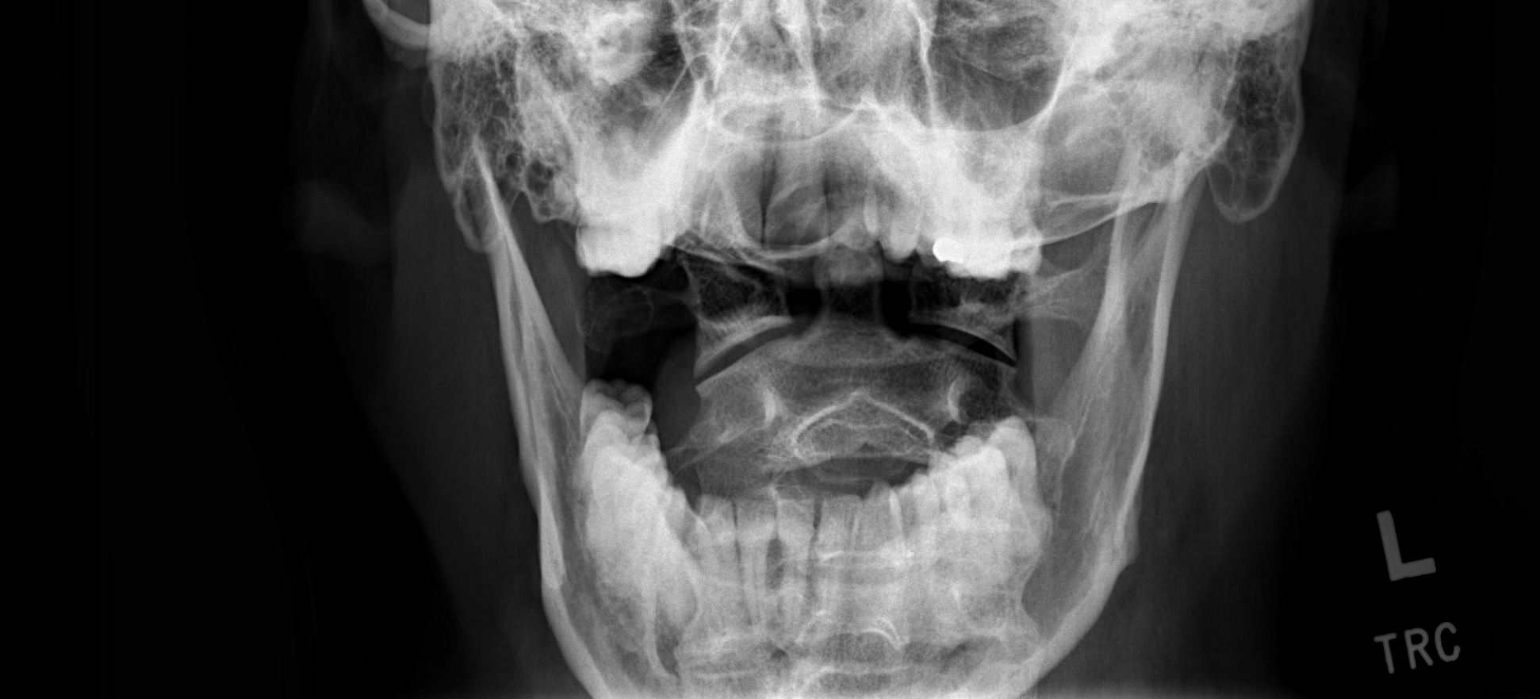

[3 of 3 positions shown; findings below may reference images not displayed]

FINDINGS: Seven cervical segments are visualized. Vertebral body height is
well maintained. Disc space narrowing at C5-6 and C6-7 is noted.
Mild osteopenic changes are seen. The odontoid is within normal
limits.
IMPRESSION: Mild degenerative change without acute abnormality

## 2022-08-03 DIAGNOSIS — Z125 Encounter for screening for malignant neoplasm of prostate: Secondary | ICD-10-CM | POA: Diagnosis not present

## 2022-08-03 DIAGNOSIS — Z0000 Encounter for general adult medical examination without abnormal findings: Secondary | ICD-10-CM | POA: Diagnosis not present

## 2022-08-03 DIAGNOSIS — F419 Anxiety disorder, unspecified: Secondary | ICD-10-CM | POA: Diagnosis not present

## 2022-08-03 DIAGNOSIS — R030 Elevated blood-pressure reading, without diagnosis of hypertension: Secondary | ICD-10-CM | POA: Diagnosis not present

## 2022-08-03 DIAGNOSIS — I77810 Thoracic aortic ectasia: Secondary | ICD-10-CM | POA: Diagnosis not present

## 2022-08-03 DIAGNOSIS — E782 Mixed hyperlipidemia: Secondary | ICD-10-CM | POA: Diagnosis not present

## 2022-08-05 ENCOUNTER — Telehealth: Payer: Self-pay | Admitting: Cardiovascular Disease

## 2022-08-05 NOTE — Telephone Encounter (Signed)
Per Dr. Johnsie Cancel, She's not in Epic:  Palpitations benign Echo was normal dyspnea does not appear to be cardiac.  Called Dr. Roma Kayser office back with message above. They will call back if they have any further questions.

## 2022-08-05 NOTE — Telephone Encounter (Signed)
New Message:      Theodis Sato is calling from Dr Francis Gaines office. She says Dr Mannie Stabile would like Dr Kyla Balzarine recommendation from his office visit that he had on 09-09-21.

## 2022-09-16 DIAGNOSIS — M542 Cervicalgia: Secondary | ICD-10-CM | POA: Diagnosis not present

## 2022-09-16 DIAGNOSIS — I10 Essential (primary) hypertension: Secondary | ICD-10-CM | POA: Diagnosis not present

## 2022-09-28 DIAGNOSIS — M5412 Radiculopathy, cervical region: Secondary | ICD-10-CM | POA: Diagnosis not present

## 2022-09-28 DIAGNOSIS — Z0189 Encounter for other specified special examinations: Secondary | ICD-10-CM | POA: Diagnosis not present

## 2022-09-28 DIAGNOSIS — M542 Cervicalgia: Secondary | ICD-10-CM | POA: Diagnosis not present

## 2023-08-25 DIAGNOSIS — I10 Essential (primary) hypertension: Secondary | ICD-10-CM | POA: Diagnosis not present

## 2023-08-25 DIAGNOSIS — Z0000 Encounter for general adult medical examination without abnormal findings: Secondary | ICD-10-CM | POA: Diagnosis not present

## 2023-08-25 DIAGNOSIS — E782 Mixed hyperlipidemia: Secondary | ICD-10-CM | POA: Diagnosis not present

## 2023-08-25 DIAGNOSIS — Z125 Encounter for screening for malignant neoplasm of prostate: Secondary | ICD-10-CM | POA: Diagnosis not present

## 2023-08-25 DIAGNOSIS — Z23 Encounter for immunization: Secondary | ICD-10-CM | POA: Diagnosis not present

## 2023-08-25 DIAGNOSIS — I77810 Thoracic aortic ectasia: Secondary | ICD-10-CM | POA: Diagnosis not present

## 2023-08-25 DIAGNOSIS — F419 Anxiety disorder, unspecified: Secondary | ICD-10-CM | POA: Diagnosis not present

## 2023-11-18 NOTE — Progress Notes (Deleted)
 CARDIOLOGY CONSULT NOTE       Patient ID: Matthew Mack MRN: [id number] DOB/AGE: [birth year] 58 y.o.   Referring Physician: Hagler Primary Physician: Aliene Beams, MD Primary Cardiologist: Eden Emms Reason for Consultation: Palpitations/Dyspnea   HPI:  58 y.o. referred by Dr Tracie Harrier for palpitations and dyspnea  First seen in consult 09/09/21 History of obesity, HLD and diverticulitis post sigmoid colectomay He takes a lot of vitamins including Ashwagandha   He is also on Zoloft for anxiety/depression He was hospitalized with COVID 03/30/20  and RX with oxygen IV remdesivir and steroids CXR showed patchy multifocal airspace opacities consistent with atypical pneumonia  He really has not had any acute symptoms For over 20 years notices his heart skip from time to time Only seconds nothing sustained. Has been taking supplements for decades and not related.  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   ***   ROS All other systems reviewed and negative except as noted above  Past Medical History:  Diagnosis Date   Diverticulitis    Hyperlipidemia    Obesity     Family History  Problem Relation Age of Onset   Diabetes Mother     Social History   Socioeconomic History   Marital status: Single    Spouse name: Not on file   Number of children: Not on file   Years of education: Not on file   Highest education level: Not on file  Occupational History   Not on file  Tobacco Use   Smoking status: Never   Smokeless tobacco: Never  Vaping Use   Vaping status: Never Used  Substance and Sexual Activity   Alcohol use: Not Currently   Drug use: Never   Sexual activity: Not on file  Other Topics Concern   Not on file  Social History Narrative   Not on file   Social  Drivers of Health   Financial Resource Strain: Not on file  Food Insecurity: Not on file  Transportation Needs: Not on file  Physical Activity: Not on file  Stress: Not on file  Social Connections: Not on file  Intimate Partner Violence: Not on file    Past Surgical History:  Procedure Laterality Date   HERNIA REPAIR     LAPAROSCOPIC SIGMOID COLECTOMY N/A 01/17/2020   Procedure: LAPAROSCOPIC ASSISTED SIGMOID COLECTOMY;  Surgeon: Griselda Miner, MD;  Location: [facility] OR;  Service: General;  Laterality: N/A;      Current Outpatient Medications:    ASHWAGANDHA PO, Take by mouth., Disp: , Rfl:    atorvastatin (LIPITOR) 40 MG tablet, Take 40 mg by mouth at bedtime., Disp: , Rfl:    Cyanocobalamin (VITAMIN B-12 PO), Take 1 tablet by mouth daily., Disp: , Rfl:    cyclobenzaprine (FLEXERIL) 10 MG tablet, Take 1 tablet (10 mg total) by mouth 3 (three) times daily as needed for muscle spasms., Disp: 90 tablet, Rfl: 3   HYDROcodone-acetaminophen (NORCO/VICODIN) 5-325 MG tablet, Take 1-2 tablets by mouth every 6 (six) hours as needed for moderate pain. (Patient not taking: Reported on 09/09/2021), Disp: 15 tablet, Rfl: 0   Multiple Vitamin (MULTIVITAMIN ADULT PO), Take 1 tablet by mouth daily., Disp: , Rfl:    Multiple Vitamins-Minerals (CENTRUM MEN PO), Take by  mouth., Disp: , Rfl:    Omega-3 Fatty Acids (FISH OIL PO), Take 1 capsule by mouth daily., Disp: , Rfl:    Probiotic Product (PROBIOTIC PO), Take by mouth., Disp: , Rfl:    Saw Palmetto, Serenoa repens, (SAW PALMETTO PO), Take by mouth., Disp: , Rfl:    sertraline (ZOLOFT) 25 MG tablet, Take 25 mg by mouth daily., Disp: , Rfl:    Thiamine HCl (VITAMIN B-1 PO), Take 1 tablet by mouth daily., Disp: , Rfl:    tiZANidine (ZANAFLEX) 4 MG tablet, TAKE 0.5-1 TABLETS (2-4 MG TOTAL) BY MOUTH 2 (TWO) TIMES DAILY AS NEEDED FOR MUSCLE SPASMS., Disp: 120 tablet, Rfl: 0    Physical Exam: There were no vitals taken for this visit.    Affect  appropriate Healthy:  appears stated age HEENT: normal Neck supple with no adenopathy JVP normal no bruits no thyromegaly Lungs clear with no wheezing and good diaphragmatic motion Heart:  S1/S2 no murmur, no rub, gallop or click PMI normal Abdomen: benighn,post sigmoid colectomy  Distal pulses intact with no bruits No edema Neuro non-focal Skin warm and dry No muscular weakness   Labs:   Lab Results  Component Value Date   WBC 12.8 (H) 03/30/2020   HGB 15.2 03/30/2020   HCT 45.5 03/30/2020   MCV 91.0 03/30/2020   PLT 394 03/30/2020   No results for input(s): "NA", "K", "CL", "CO2", "BUN", "CREATININE", "CALCIUM", "PROT", "BILITOT", "ALKPHOS", "ALT", "AST", "GLUCOSE" in the last 168 hours.  Invalid input(s): "LABALBU" No results found for: "CKTOTAL", "CKMB", "CKMBINDEX", "TROPONINI" No results found for: "CHOL" No results found for: "HDL" No results found for: "LDLCALC" Lab Results  Component Value Date   TRIG 115 03/25/2020   No results found for: "CHOLHDL" No results found for: "LDLDIRECT"    Radiology: No results found.  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

## 2023-12-01 ENCOUNTER — Ambulatory Visit: Payer: BC Managed Care – PPO | Admitting: Cardiovascular Disease

## 2023-12-07 NOTE — Progress Notes (Signed)
 CARDIOLOGY CONSULT NOTE       Patient ID: Matthew Mack MRN: [id number] DOB/AGE: [birth year] 58 y.o.   Referring Physician: Hagler Primary Physician: Aliene Beams, MD Primary Cardiologist: Eden Emms Reason for Consultation: Palpitations/Dyspnea   HPI:  58 y.o. referred by Dr Tracie Harrier for palpitations and dyspnea  First seen in consult 09/09/21 History of obesity, HLD and diverticulitis post sigmoid colectomay He takes a lot of vitamins including Ashwagandha   He is also on Zoloft for anxiety/depression He was hospitalized with COVID 03/30/20  and RX with oxygen IV remdesivir and steroids CXR showed patchy multifocal airspace opacities consistent with atypical pneumonia  He really has not had any acute symptoms For over 20 years notices his heart skip from time to time Only seconds nothing sustained. Has been taking supplements for decades and not related.  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     ROS All other systems reviewed and negative except as noted above  Past Medical History:  Diagnosis Date   Diverticulitis    Hyperlipidemia    Obesity     Family History  Problem Relation Age of Onset   Diabetes Mother     Social History   Socioeconomic History   Marital status: Single    Spouse name: Not on file   Number of children: Not on file   Years of education: Not on file   Highest education level: Not on file  Occupational History   Not on file  Tobacco Use   Smoking status: Never   Smokeless tobacco: Never  Vaping Use   Vaping status: Never Used  Substance and Sexual Activity   Alcohol use: Not Currently   Drug use: Never   Sexual activity: Not on file  Other  Topics Concern   Not on file  Social History Narrative   Not on file   Social Drivers of Health   Financial Resource Strain: Not on file  Food Insecurity: Not on file  Transportation Needs: Not on file  Physical Activity: Not on file  Stress: Not on file  Social Connections: Not on file  Intimate Partner Violence: Not on file    Past Surgical History:  Procedure Laterality Date   HERNIA REPAIR     LAPAROSCOPIC SIGMOID COLECTOMY N/A 01/17/2020   Procedure: LAPAROSCOPIC ASSISTED SIGMOID COLECTOMY;  Surgeon: Griselda Miner, MD;  Location: [facility] OR;  Service: General;  Laterality: N/A;      Current Outpatient Medications:    amLODipine (NORVASC) 10 MG tablet, Take 10 mg by mouth daily., Disp: , Rfl:    ASHWAGANDHA PO, Take by mouth., Disp: , Rfl:    atorvastatin (LIPITOR) 40 MG tablet, Take 40 mg by mouth at bedtime., Disp: , Rfl:    Cyanocobalamin (VITAMIN B-12 PO), Take 1 tablet by mouth daily., Disp: , Rfl:    Multiple Vitamin (MULTIVITAMIN ADULT PO), Take 1 tablet by mouth daily., Disp: , Rfl:    Multiple Vitamins-Minerals (CENTRUM MEN PO), Take by mouth., Disp: , Rfl:    Omega-3 Fatty Acids (FISH OIL PO), Take 1 capsule by mouth daily., Disp: , Rfl:    Probiotic  Product (PROBIOTIC PO), Take by mouth., Disp: , Rfl:    Saw Palmetto, Serenoa repens, (SAW PALMETTO PO), Take by mouth., Disp: , Rfl:    sertraline (ZOLOFT) 25 MG tablet, Take 25 mg by mouth daily., Disp: , Rfl:    Thiamine HCl (VITAMIN B-1 PO), Take 1 tablet by mouth daily., Disp: , Rfl:    cyclobenzaprine (FLEXERIL) 10 MG tablet, Take 1 tablet (10 mg total) by mouth 3 (three) times daily as needed for muscle spasms., Disp: 90 tablet, Rfl: 3   HYDROcodone-acetaminophen (NORCO/VICODIN) 5-325 MG tablet, Take 1-2 tablets by mouth every 6 (six) hours as needed for moderate pain. (Patient not taking: Reported on 12/19/2023), Disp: 15 tablet, Rfl: 0   tiZANidine (ZANAFLEX) 4 MG tablet, TAKE 0.5-1 TABLETS (2-4 MG TOTAL) BY MOUTH 2  (TWO) TIMES DAILY AS NEEDED FOR MUSCLE SPASMS., Disp: 120 tablet, Rfl: 0   traZODone (DESYREL) 50 MG tablet, Take 50 mg by mouth at bedtime as needed. (Patient not taking: Reported on 12/19/2023), Disp: , Rfl:     Physical Exam: Blood pressure 120/84, pulse 73, SpO2 95%.    Affect appropriate Healthy:  appears stated age HEENT: normal Neck supple with no adenopathy JVP normal no bruits no thyromegaly Lungs clear with no wheezing and good diaphragmatic motion Heart:  S1/S2 no murmur, no rub, gallop or click PMI normal Abdomen: benighn,post sigmoid colectomy  Distal pulses intact with no bruits No edema Neuro non-focal Skin warm and dry No muscular weakness   Labs:   Lab Results  Component Value Date   WBC 12.8 (H) 03/30/2020   HGB 15.2 03/30/2020   HCT 45.5 03/30/2020   MCV 91.0 03/30/2020   PLT 394 03/30/2020   No results for input(s): "NA", "K", "CL", "CO2", "BUN", "CREATININE", "CALCIUM", "PROT", "BILITOT", "ALKPHOS", "ALT", "AST", "GLUCOSE" in the last 168 hours.  Invalid input(s): "LABALBU" No results found for: "CKTOTAL", "CKMB", "CKMBINDEX", "TROPONINI" No results found for: "CHOL" No results found for: "HDL" No results found for: "LDLCALC" Lab Results  Component Value Date   TRIG 115 03/25/2020   No results found for: "CHOLHDL" No results found for: "LDLDIRECT"    Radiology: No results found.  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

## 2023-12-19 ENCOUNTER — Encounter: Payer: Self-pay | Admitting: Cardiology

## 2023-12-19 ENCOUNTER — Ambulatory Visit: Payer: BC Managed Care – PPO | Attending: Cardiovascular Disease | Admitting: Cardiovascular Disease

## 2023-12-19 ENCOUNTER — Other Ambulatory Visit: Payer: Self-pay | Admitting: Cardiovascular Disease

## 2023-12-19 ENCOUNTER — Other Ambulatory Visit: Payer: Self-pay

## 2023-12-19 VITALS — BP 120/84 | HR 73

## 2023-12-19 DIAGNOSIS — R0609 Other forms of dyspnea: Secondary | ICD-10-CM

## 2023-12-19 DIAGNOSIS — R002 Palpitations: Secondary | ICD-10-CM

## 2023-12-19 DIAGNOSIS — I7121 Aneurysm of the ascending aorta, without rupture: Secondary | ICD-10-CM

## 2023-12-19 NOTE — Patient Instructions (Signed)
 Medication Instructions:  Your physician recommends that you continue on your current medications as directed. Please refer to the Current Medication list given to you today.  *If you need a refill on your cardiac medications before your next appointment, please call your pharmacy*  Lab Work: Your physician recommends that you have lab work today- BMET  If you have labs (blood work) drawn today and your tests are completely normal, you will receive your results only by: MyChart Message (if you have MyChart) OR A paper copy in the mail If you have any lab test that is abnormal or we need to change your treatment, we will call you to review the results.  Testing/Procedures: Your physician has requested that you have an echocardiogram. Echocardiography is a painless test that uses sound waves to create images of your heart. It provides your doctor with information about the size and shape of your heart and how well your heart's chambers and valves are working. This procedure takes approximately one hour. There are no restrictions for this procedure. Please do NOT wear cologne, perfume, aftershave, or lotions (deodorant is allowed). Please arrive 15 minutes prior to your appointment time.  Please note: We ask at that you not bring children with you during ultrasound (echo/ vascular) testing. Due to room size and safety concerns, children are not allowed in the ultrasound rooms during exams. Our front office staff cannot provide observation of children in our lobby area while testing is being conducted. An adult accompanying a patient to their appointment will only be allowed in the ultrasound room at the discretion of the ultrasound technician under special circumstances. We apologize for any inconvenience. Your physician has requested that you have gated chest CTA. Cardiac computed tomography (CT) is a painless test that uses an x-ray machine to take clear, detailed pictures of your heart. For  further information please visit [email or web address] Please follow instruction sheet as given.  Follow-Up: At [facility], you and your health needs are our priority.  As part of our continuing mission to provide you with exceptional heart care, we have created designated Provider Care Teams.  These Care Teams include your primary Cardiologist (physician) and Advanced Practice Providers (APPs -  Physician Assistants and Nurse Practitioners) who all work together to provide you with the care you need, when you need it.  We recommend signing up for the patient portal called "MyChart".  Sign up information is provided on this After Visit Summary.  MyChart is used to connect with patients for Virtual Visits (Telemedicine).  Patients are able to view lab/test results, encounter notes, upcoming appointments, etc.  Non-urgent messages can be sent to your provider as well.   To learn more about what you can do with MyChart, go to [email or web address].    Your next appointment:   1 year(s)  Provider:   Charlton Haws, MD     Other Instructions       1st Floor: - Lobby - Registration  - Pharmacy  - Lab - Cafe  2nd Floor: - PV Lab - Diagnostic Testing (echo, CT, nuclear med)  3rd Floor: - Vacant  4th Floor: - TCTS (cardiothoracic surgery) - AFib Clinic - Structural Heart Clinic - Vascular Surgery  - Vascular Ultrasound  5th Floor: - [facility] (general and EP) - Clinical Pharmacy for coumadin, hypertension, lipid, weight-loss medications, and med management appointments    Valet parking services will be available as well.

## 2023-12-20 LAB — BASIC METABOLIC PANEL
BUN/Creatinine Ratio: 9 (ref 9–20)
BUN: 10 mg/dL (ref 6–24)
CO2: 24 mmol/L (ref 20–29)
Calcium: 10.7 mg/dL — ABNORMAL HIGH (ref 8.7–10.2)
Chloride: 105 mmol/L (ref 96–106)
Creatinine, Ser: 1.16 mg/dL (ref 0.76–1.27)
Glucose: 92 mg/dL (ref 70–99)
Potassium: 5.1 mmol/L (ref 3.5–5.2)
Sodium: 145 mmol/L — ABNORMAL HIGH (ref 134–144)
eGFR: 74 mL/min/{1.73_m2} (ref >59)

## 2024-01-19 ENCOUNTER — Ambulatory Visit (HOSPITAL_COMMUNITY): Attending: Internal Medicine

## 2024-01-19 DIAGNOSIS — I7121 Aneurysm of the ascending aorta, without rupture: Secondary | ICD-10-CM | POA: Diagnosis not present

## 2024-01-19 DIAGNOSIS — R002 Palpitations: Secondary | ICD-10-CM | POA: Diagnosis not present

## 2024-01-19 DIAGNOSIS — R0609 Other forms of dyspnea: Secondary | ICD-10-CM | POA: Insufficient documentation

## 2024-01-19 LAB — ECHOCARDIOGRAM COMPLETE
AR max vel: 2.01 cm2
AV Area VTI: 2.51 cm2
AV Area mean vel: 2.11 cm2
AV Mean grad: 4.9 mmHg
AV Peak grad: 11 mmHg
Ao pk vel: 1.66 m/s
Area-P 1/2: 2.63 cm2
S' Lateral: 2.8 cm

## 2024-01-27 ENCOUNTER — Telehealth: Admitting: Physician Assistant

## 2024-01-27 DIAGNOSIS — R1011 Right upper quadrant pain: Secondary | ICD-10-CM

## 2024-01-27 NOTE — Progress Notes (Signed)
 Virtual Visit Consent   Matthew Mack, you are scheduled for a virtual visit with a [facility] provider today. Just as with appointments in the office, your consent must be obtained to participate. Your consent will be active for this visit and any virtual visit you may have with one of our providers in the next 365 days. If you have a MyChart account, a copy of this consent can be sent to you electronically.  As this is a virtual visit, video technology does not allow for your provider to perform a traditional examination. This may limit your provider's ability to fully assess your condition. If your provider identifies any concerns that need to be evaluated in person or the need to arrange testing (such as labs, EKG, etc.), we will make arrangements to do so. Although advances in technology are sophisticated, we cannot ensure that it will always work on either your end or our end. If the connection with a video visit is poor, the visit may have to be switched to a telephone visit. With either a video or telephone visit, we are not always able to ensure that we have a secure connection.  By engaging in this virtual visit, you consent to the provision of healthcare and authorize for your insurance to be billed (if applicable) for the services provided during this visit. Depending on your insurance coverage, you may receive a charge related to this service.  I need to obtain your verbal consent now. Are you willing to proceed with your visit today? [name] has provided verbal consent on 01/27/2024 for a virtual visit (video or telephone). Angelia Kelp, PA-C  Date: 01/27/2024 9:43 AM   Virtual Visit via Video Note   I, Angelia Kelp, connected with  Matthew Mack  ([id number], 1966/03/11) on 01/27/24 at  9:30 AM EDT by a video-enabled telemedicine application and verified that I am speaking with the correct person using two identifiers.  Location: Patient: Virtual Visit Location  Patient: Home Provider: Virtual Visit Location Provider: Home Office   I discussed the limitations of evaluation and management by telemedicine and the availability of in person appointments. The patient expressed understanding and agreed to proceed.    History of Present Illness: Matthew Mack is a 58 y.o. who identifies as a male who was assigned male at birth, and is being seen today for RUQ pain.  HPI: Abdominal Pain This is a new problem. The current episode started more than 1 month ago (5 weeks). The onset quality is gradual. The problem occurs constantly. The problem has been unchanged. The pain is located in the RUQ. The pain is mild. The quality of the pain is colicky. The abdominal pain radiates to the right shoulder and back. Associated symptoms include belching and flatus. Pertinent negatives include no constipation, diarrhea, fever, headaches, hematochezia, melena, nausea or vomiting. The pain is aggravated by certain positions and palpation. The pain is relieved by Nothing. He has tried antacids (Tums) for the symptoms. The treatment provided no relief.     Problems:  Patient Active Problem List   Diagnosis Date Noted   Myofascial pain on left side 08/22/2020   Neck pain 08/22/2020   Pain of left scapula 08/22/2020   Pneumonia due to COVID-19 virus 03/25/2020   SOB (shortness of breath) 03/25/2020   Severe sepsis (HCC) 03/25/2020   Hyperlipidemia    Fever    Sigmoid diverticulitis 01/17/2020   Perforated diverticulum of large intestine 06/24/2019    Allergies:  Allergies  Allergen Reactions   Ciprofloxacin  Other (See Comments)   Penicillins Hives   Sulfa Antibiotics Itching   Medications:  Current Outpatient Medications:    amLODipine (NORVASC) 10 MG tablet, Take 10 mg by mouth daily., Disp: , Rfl:    ASHWAGANDHA PO, Take by mouth., Disp: , Rfl:    atorvastatin (LIPITOR) 40 MG tablet, Take 40 mg by mouth at bedtime., Disp: , Rfl:    Cyanocobalamin (VITAMIN B-12  PO), Take 1 tablet by mouth daily., Disp: , Rfl:    cyclobenzaprine  (FLEXERIL ) 10 MG tablet, Take 1 tablet (10 mg total) by mouth 3 (three) times daily as needed for muscle spasms., Disp: 90 tablet, Rfl: 3   HYDROcodone -acetaminophen  (NORCO/VICODIN) 5-325 MG tablet, Take 1-2 tablets by mouth every 6 (six) hours as needed for moderate pain. (Patient not taking: Reported on 12/19/2023), Disp: 15 tablet, Rfl: 0   Multiple Vitamin (MULTIVITAMIN ADULT PO), Take 1 tablet by mouth daily., Disp: , Rfl:    Multiple Vitamins-Minerals (CENTRUM MEN PO), Take by mouth., Disp: , Rfl:    Omega-3 Fatty Acids (FISH OIL PO), Take 1 capsule by mouth daily., Disp: , Rfl:    Probiotic Product (PROBIOTIC PO), Take by mouth., Disp: , Rfl:    Saw Palmetto, Serenoa repens, (SAW PALMETTO PO), Take by mouth., Disp: , Rfl:    sertraline (ZOLOFT) 25 MG tablet, Take 25 mg by mouth daily., Disp: , Rfl:    Thiamine HCl (VITAMIN B-1 PO), Take 1 tablet by mouth daily., Disp: , Rfl:    tiZANidine  (ZANAFLEX ) 4 MG tablet, TAKE 0.5-1 TABLETS (2-4 MG TOTAL) BY MOUTH 2 (TWO) TIMES DAILY AS NEEDED FOR MUSCLE SPASMS., Disp: 120 tablet, Rfl: 0   traZODone (DESYREL) 50 MG tablet, Take 50 mg by mouth at bedtime as needed. (Patient not taking: Reported on 12/19/2023), Disp: , Rfl:   Observations/Objective: Patient is well-developed, well-nourished in no acute distress.  Resting comfortably at home.  Head is normocephalic, atraumatic.  No labored breathing.  Speech is clear and coherent with logical content.  Patient is alert and oriented at baseline.    Assessment and Plan: 1. RUQ pain (Primary)  - Symptoms consistent with potential gallbladder disease, cannot rule out gastritis or PUD - Advised to seek in person evaluation for gallbladder issues - Information about diet for gallbladder disease provided to patient through AVS - No charge due to not able to assist with work-up virtually  Follow Up Instructions: I discussed the  assessment and treatment plan with the patient. The patient was provided an opportunity to ask questions and all were answered. The patient agreed with the plan and demonstrated an understanding of the instructions.  A copy of instructions were sent to the patient via MyChart unless otherwise noted below.    The patient was advised to call back or seek an in-person evaluation if the symptoms worsen or if the condition fails to improve as anticipated.    Angelia Kelp, PA-C

## 2024-01-27 NOTE — Patient Instructions (Signed)
 Matthew Mack, thank you for joining Angelia Kelp, PA-C for today's virtual visit.  While this provider is not your primary care provider (PCP), if your PCP is located in our provider database this encounter information will be shared with them immediately following your visit.   A [place] MyChart account gives you access to today's visit and all your visits, tests, and labs performed at [facility] " click here if you don't have a [place] MyChart account or go to [email or web address]  Consent: (Patient) Matthew Mack provided verbal consent for this virtual visit at the beginning of the encounter.  Current Medications:  Current Outpatient Medications:    amLODipine (NORVASC) 10 MG tablet, Take 10 mg by mouth daily., Disp: , Rfl:    ASHWAGANDHA PO, Take by mouth., Disp: , Rfl:    atorvastatin (LIPITOR) 40 MG tablet, Take 40 mg by mouth at bedtime., Disp: , Rfl:    Cyanocobalamin (VITAMIN B-12 PO), Take 1 tablet by mouth daily., Disp: , Rfl:    cyclobenzaprine  (FLEXERIL ) 10 MG tablet, Take 1 tablet (10 mg total) by mouth 3 (three) times daily as needed for muscle spasms., Disp: 90 tablet, Rfl: 3   HYDROcodone -acetaminophen  (NORCO/VICODIN) 5-325 MG tablet, Take 1-2 tablets by mouth every 6 (six) hours as needed for moderate pain. (Patient not taking: Reported on 12/19/2023), Disp: 15 tablet, Rfl: 0   Multiple Vitamin (MULTIVITAMIN ADULT PO), Take 1 tablet by mouth daily., Disp: , Rfl:    Multiple Vitamins-Minerals (CENTRUM MEN PO), Take by mouth., Disp: , Rfl:    Omega-3 Fatty Acids (FISH OIL PO), Take 1 capsule by mouth daily., Disp: , Rfl:    Probiotic Product (PROBIOTIC PO), Take by mouth., Disp: , Rfl:    Saw Palmetto, Serenoa repens, (SAW PALMETTO PO), Take by mouth., Disp: , Rfl:    sertraline (ZOLOFT) 25 MG tablet, Take 25 mg by mouth daily., Disp: , Rfl:    Thiamine HCl (VITAMIN B-1 PO), Take 1 tablet by mouth daily., Disp: , Rfl:    tiZANidine  (ZANAFLEX ) 4  MG tablet, TAKE 0.5-1 TABLETS (2-4 MG TOTAL) BY MOUTH 2 (TWO) TIMES DAILY AS NEEDED FOR MUSCLE SPASMS., Disp: 120 tablet, Rfl: 0   traZODone (DESYREL) 50 MG tablet, Take 50 mg by mouth at bedtime as needed. (Patient not taking: Reported on 12/19/2023), Disp: , Rfl:    Medications ordered in this encounter:  No orders of the defined types were placed in this encounter.    *If you need refills on other medications prior to your next appointment, please contact your pharmacy*  Follow-Up: Call back or seek an in-person evaluation if the symptoms worsen or if the condition fails to improve as anticipated.   Virtual Care [phone]  Other Instructions Gallbladder Eating Plan High blood cholesterol, obesity, a sedentary lifestyle, an unhealthy diet, and diabetes are risk factors for developing gallstones. If you have a gallbladder condition, you may have trouble digesting fats and tolerating high fat intake. Eating a low-fat diet can help reduce your symptoms and may be helpful before and after having surgery to remove your gallbladder (cholecystectomy). Your health care provider may recommend that you work with a dietitian to help you reduce the amount of fat in your diet. What are tips for following this plan? General guidelines Limit your fat intake to less than 30% of your total daily calories. If you eat around 1,800 calories each day, this means eating less than 60 grams (g) of fat per day. Fat  is an important part of a healthy diet. Eating a low-fat diet can make it hard to maintain a healthy body weight. Ask your dietitian how much fat, calories, and other nutrients you need each day. Eat small, frequent meals throughout the day instead of three large meals. Drink at least 8-10 cups (1.9-2.4 L) of fluid a day. Drink enough fluid to keep your urine pale yellow. If you drink alcohol: Limit how much you have to: 0-1 drink a day for women who are not pregnant. 0-2 drinks a day  for men. Know how much alcohol is in a drink. In the [place] one drink equals one 12 oz bottle of beer (355 mL), one 5 oz glass of wine (148 mL), or one 1 oz glass of hard liquor (44 mL). Reading food labels  Check nutrition facts on food labels for the amount of fat per serving. Choose foods with less than 3 grams of fat per serving. Shopping Choose nonfat and low-fat healthy foods. Look for the words "nonfat," "low-fat," or "fat-free." Avoid buying processed or prepackaged foods. Cooking Cook using low-fat methods, such as baking, broiling, grilling, or boiling. Cook with small amounts of healthy fats, such as olive oil, grapeseed oil, canola oil, avocado oil, or sunflower oil. What foods are recommended?  All fresh, frozen, or canned fruits and vegetables. Whole grains. Low-fat or nonfat (skim) milk and yogurt. Lean meat, skinless poultry, fish, eggs, and beans. Low-fat protein supplement powders or drinks. Spices and herbs. The items listed above may not be a complete list of foods and beverages you can eat and drink. Contact a dietitian for more information. What foods are not recommended? High-fat foods. These include baked goods, fast food, fatty cuts of meat, ice cream, french toast, sweet rolls, pizza, cheese bread, foods covered with butter, creamy sauces, or cheese. Fried foods. These include french fries, tempura, battered fish, breaded chicken, fried breads, and sweets. Foods that cause bloating and gas. The items listed above may not be a complete list of foods that you should avoid. Contact a dietitian for more information. Summary A low-fat diet can be helpful if you have a gallbladder condition, or before and after gallbladder surgery. Limit your fat intake to less than 30% of your total daily calories. This is about 60 g of fat if you eat 1,800 calories each day. Eat small, frequent meals throughout the day instead of three large meals. This information is not intended  to replace advice given to you by your health care provider. Make sure you discuss any questions you have with your health care provider. Document Revised: 09/11/2021 Document Reviewed: 09/11/2021 Elsevier Patient Education  2024 [employer]   If you have been instructed to have an in-person evaluation today at a local Urgent Care facility, please use the link below. It will take you to a list of all of our available [facility], including address, phone number and hours of operation. Please do not delay care.  [facility]  If you or a family member do not have a primary care provider, use the link below to schedule a visit and establish care. When you choose a [facility] primary care physician or advanced practice provider, you gain a long-term partner in health. Find a Primary Care Provider  Learn more about [facility] in-office and virtual care options: [place] - Get Care Now

## 2024-02-24 ENCOUNTER — Ambulatory Visit (HOSPITAL_COMMUNITY)
Admission: RE | Admit: 2024-02-24 | Discharge: 2024-02-24 | Disposition: A | Discharge status: Home / Self Care | Source: Ambulatory Visit | Attending: Cardiovascular Disease | Admitting: Cardiovascular Disease

## 2024-02-24 DIAGNOSIS — R002 Palpitations: Secondary | ICD-10-CM | POA: Insufficient documentation

## 2024-02-24 DIAGNOSIS — I7121 Aneurysm of the ascending aorta, without rupture: Secondary | ICD-10-CM | POA: Insufficient documentation

## 2024-02-24 DIAGNOSIS — R0609 Other forms of dyspnea: Secondary | ICD-10-CM | POA: Insufficient documentation

## 2024-02-24 MED ORDER — IOHEXOL 350 MG/ML SOLN
75.0000 mL | Freq: Once | INTRAVENOUS | Status: AC | PRN
  Administered 2024-02-24: 75 mL via INTRAVENOUS

## 2024-03-17 ENCOUNTER — Ambulatory Visit: Payer: Self-pay | Admitting: Cardiovascular Disease

## 2024-04-06 DIAGNOSIS — I10 Essential (primary) hypertension: Secondary | ICD-10-CM | POA: Diagnosis not present

## 2024-04-06 DIAGNOSIS — I77810 Thoracic aortic ectasia: Secondary | ICD-10-CM | POA: Diagnosis not present

## 2024-04-06 DIAGNOSIS — R946 Abnormal results of thyroid function studies: Secondary | ICD-10-CM | POA: Diagnosis not present

## 2024-04-06 DIAGNOSIS — E782 Mixed hyperlipidemia: Secondary | ICD-10-CM | POA: Diagnosis not present

## 2024-09-25 DIAGNOSIS — Z125 Encounter for screening for malignant neoplasm of prostate: Secondary | ICD-10-CM | POA: Diagnosis not present

## 2024-09-25 DIAGNOSIS — Z0000 Encounter for general adult medical examination without abnormal findings: Secondary | ICD-10-CM | POA: Diagnosis not present

## 2024-09-25 DIAGNOSIS — F419 Anxiety disorder, unspecified: Secondary | ICD-10-CM | POA: Diagnosis not present

## 2024-09-25 DIAGNOSIS — M79642 Pain in left hand: Secondary | ICD-10-CM | POA: Diagnosis not present

## 2024-09-25 DIAGNOSIS — E782 Mixed hyperlipidemia: Secondary | ICD-10-CM | POA: Diagnosis not present

## 2024-09-25 DIAGNOSIS — I10 Essential (primary) hypertension: Secondary | ICD-10-CM | POA: Diagnosis not present

## 2024-10-02 DIAGNOSIS — M65332 Trigger finger, left middle finger: Secondary | ICD-10-CM | POA: Diagnosis not present
